# Patient Record
Sex: Female | Born: 1970 | Race: White | Hispanic: No | Marital: Married | Smoking: Never smoker
Health system: Southern US, Community
[De-identification: ages and names within clinical notes are randomized; demographics above are authoritative.]

## PROBLEM LIST (undated history)

## (undated) DIAGNOSIS — D649 Anemia, unspecified: Secondary | ICD-10-CM

## (undated) DIAGNOSIS — J189 Pneumonia, unspecified organism: Secondary | ICD-10-CM

## (undated) DIAGNOSIS — F32A Depression, unspecified: Secondary | ICD-10-CM

## (undated) DIAGNOSIS — F319 Bipolar disorder, unspecified: Secondary | ICD-10-CM

## (undated) DIAGNOSIS — G43909 Migraine, unspecified, not intractable, without status migrainosus: Secondary | ICD-10-CM

## (undated) DIAGNOSIS — M5136 Other intervertebral disc degeneration, lumbar region: Secondary | ICD-10-CM

## (undated) DIAGNOSIS — E079 Disorder of thyroid, unspecified: Secondary | ICD-10-CM

## (undated) DIAGNOSIS — M51369 Other intervertebral disc degeneration, lumbar region without mention of lumbar back pain or lower extremity pain: Secondary | ICD-10-CM

## (undated) DIAGNOSIS — F419 Anxiety disorder, unspecified: Secondary | ICD-10-CM

## (undated) DIAGNOSIS — K2970 Gastritis, unspecified, without bleeding: Secondary | ICD-10-CM

## (undated) DIAGNOSIS — E039 Hypothyroidism, unspecified: Secondary | ICD-10-CM

## (undated) DIAGNOSIS — I341 Nonrheumatic mitral (valve) prolapse: Secondary | ICD-10-CM

## (undated) DIAGNOSIS — Z9889 Other specified postprocedural states: Secondary | ICD-10-CM

## (undated) DIAGNOSIS — N189 Chronic kidney disease, unspecified: Secondary | ICD-10-CM

## (undated) DIAGNOSIS — R112 Nausea with vomiting, unspecified: Secondary | ICD-10-CM

## (undated) HISTORY — PX: ABDOMINAL HYSTERECTOMY: SHX81

## (undated) HISTORY — DX: Other intervertebral disc degeneration, lumbar region: M51.36

## (undated) HISTORY — PX: PLANTAR FASCIA SURGERY: SHX746

## (undated) HISTORY — PX: US ECHOCARDIOGRAPHY: HXRAD669

## (undated) HISTORY — DX: Other intervertebral disc degeneration, lumbar region without mention of lumbar back pain or lower extremity pain: M51.369

## (undated) HISTORY — PX: DILATION AND CURETTAGE OF UTERUS: SHX78

## (undated) HISTORY — PX: CYSTECTOMY: SUR359

---

## 2001-10-15 ENCOUNTER — Other Ambulatory Visit: Admission: RE | Admit: 2001-10-15 | Discharge: 2001-10-15 | Payer: Self-pay | Admitting: Obstetrics and Gynecology

## 2002-06-06 ENCOUNTER — Other Ambulatory Visit (HOSPITAL_COMMUNITY): Admission: RE | Admit: 2002-06-06 | Discharge: 2002-06-14 | Payer: Self-pay | Admitting: Psychiatry

## 2002-12-03 ENCOUNTER — Other Ambulatory Visit: Admission: RE | Admit: 2002-12-03 | Discharge: 2002-12-03 | Payer: Self-pay | Admitting: Obstetrics and Gynecology

## 2003-02-07 ENCOUNTER — Ambulatory Visit (HOSPITAL_COMMUNITY): Admission: RE | Admit: 2003-02-07 | Discharge: 2003-02-07 | Payer: Self-pay | Admitting: Obstetrics and Gynecology

## 2003-12-26 ENCOUNTER — Other Ambulatory Visit: Admission: RE | Admit: 2003-12-26 | Discharge: 2003-12-26 | Payer: Self-pay | Admitting: Obstetrics and Gynecology

## 2004-12-28 ENCOUNTER — Other Ambulatory Visit: Admission: RE | Admit: 2004-12-28 | Discharge: 2004-12-28 | Payer: Self-pay | Admitting: Obstetrics and Gynecology

## 2005-06-23 ENCOUNTER — Encounter: Admission: RE | Admit: 2005-06-23 | Discharge: 2005-06-23 | Payer: Self-pay | Admitting: Obstetrics and Gynecology

## 2006-01-10 ENCOUNTER — Other Ambulatory Visit: Admission: RE | Admit: 2006-01-10 | Discharge: 2006-01-10 | Payer: Self-pay | Admitting: Obstetrics and Gynecology

## 2006-05-02 ENCOUNTER — Inpatient Hospital Stay (HOSPITAL_COMMUNITY): Admission: RE | Admit: 2006-05-02 | Discharge: 2006-05-03 | Payer: Self-pay | Admitting: Obstetrics and Gynecology

## 2006-08-03 DIAGNOSIS — D229 Melanocytic nevi, unspecified: Secondary | ICD-10-CM

## 2006-08-03 HISTORY — DX: Melanocytic nevi, unspecified: D22.9

## 2010-10-31 ENCOUNTER — Encounter: Payer: Self-pay | Admitting: Obstetrics and Gynecology

## 2012-05-23 ENCOUNTER — Other Ambulatory Visit: Payer: Self-pay | Admitting: Physician Assistant

## 2012-06-28 ENCOUNTER — Other Ambulatory Visit: Payer: Self-pay | Admitting: Physician Assistant

## 2012-08-02 ENCOUNTER — Other Ambulatory Visit: Payer: Self-pay | Admitting: Physician Assistant

## 2012-08-28 ENCOUNTER — Other Ambulatory Visit: Payer: Self-pay | Admitting: Physician Assistant

## 2013-07-19 ENCOUNTER — Other Ambulatory Visit: Payer: Self-pay | Admitting: Physician Assistant

## 2013-08-22 ENCOUNTER — Emergency Department (HOSPITAL_COMMUNITY)
Admission: EM | Admit: 2013-08-22 | Discharge: 2013-08-22 | Disposition: A | Discharge status: Home / Self Care | Payer: BC Managed Care – PPO | Attending: Emergency Medicine | Admitting: Emergency Medicine

## 2013-08-22 ENCOUNTER — Encounter (HOSPITAL_COMMUNITY): Payer: Self-pay | Admitting: Emergency Medicine

## 2013-08-22 DIAGNOSIS — E039 Hypothyroidism, unspecified: Secondary | ICD-10-CM | POA: Insufficient documentation

## 2013-08-22 DIAGNOSIS — Z791 Long term (current) use of non-steroidal anti-inflammatories (NSAID): Secondary | ICD-10-CM | POA: Insufficient documentation

## 2013-08-22 DIAGNOSIS — G43909 Migraine, unspecified, not intractable, without status migrainosus: Secondary | ICD-10-CM | POA: Insufficient documentation

## 2013-08-22 DIAGNOSIS — R519 Headache, unspecified: Secondary | ICD-10-CM

## 2013-08-22 DIAGNOSIS — Z79899 Other long term (current) drug therapy: Secondary | ICD-10-CM | POA: Insufficient documentation

## 2013-08-22 DIAGNOSIS — F319 Bipolar disorder, unspecified: Secondary | ICD-10-CM | POA: Insufficient documentation

## 2013-08-22 HISTORY — DX: Bipolar disorder, unspecified: F31.9

## 2013-08-22 HISTORY — DX: Disorder of thyroid, unspecified: E07.9

## 2013-08-22 HISTORY — DX: Migraine, unspecified, not intractable, without status migrainosus: G43.909

## 2013-08-22 HISTORY — DX: Hypothyroidism, unspecified: E03.9

## 2013-08-22 MED ORDER — MAGNESIUM SULFATE 40 MG/ML IJ SOLN: 2.0000 g | Freq: Once | INTRAMUSCULAR | Status: DC

## 2013-08-22 MED ORDER — METOCLOPRAMIDE HCL 5 MG/ML IJ SOLN
10.0000 mg | Freq: Once | INTRAMUSCULAR | Status: AC
Start: 2013-08-22 — End: 2013-08-22
  Administered 2013-08-22: 10 mg via INTRAVENOUS
  Filled 2013-08-22: qty 2

## 2013-08-22 MED ORDER — PROCHLORPERAZINE EDISYLATE 5 MG/ML IJ SOLN: 10.0000 mg | Freq: Four times a day (QID) | INTRAMUSCULAR | Status: DC | PRN

## 2013-08-22 MED ORDER — SODIUM CHLORIDE 0.9 % IV BOLUS (SEPSIS)
1000.0000 mL | Freq: Once | INTRAVENOUS | Status: AC
  Administered 2013-08-22: 1000 mL via INTRAVENOUS

## 2013-08-22 MED ORDER — DIPHENHYDRAMINE HCL 50 MG/ML IJ SOLN
25.0000 mg | Freq: Once | INTRAMUSCULAR | Status: AC
  Administered 2013-08-22: 25 mg via INTRAVENOUS
  Filled 2013-08-22: qty 1

## 2013-08-22 MED ORDER — DEXAMETHASONE SODIUM PHOSPHATE 10 MG/ML IJ SOLN
10.0000 mg | Freq: Once | INTRAMUSCULAR | Status: AC
  Administered 2013-08-22: 10 mg via INTRAVENOUS
  Filled 2013-08-22: qty 1

## 2013-08-22 MED ORDER — PROCHLORPERAZINE EDISYLATE 5 MG/ML IJ SOLN
10.0000 mg | Freq: Once | INTRAMUSCULAR | Status: AC
  Administered 2013-08-22: 10 mg via INTRAVENOUS
  Filled 2013-08-22: qty 2

## 2013-08-22 NOTE — ED Notes (Signed)
Pt reports hx of migraines. Having migraine, sensitivity to light and nausea since 11/3, no relief with meds prescribed.

## 2013-08-22 NOTE — ED Provider Notes (Signed)
CSN: [id number]     Arrival date & time 08/22/13  1620 History   First MD Initiated Contact with Patient 08/22/13 1746     Chief Complaint  Patient presents with  . Migraine   (Consider location/radiation/quality/duration/timing/severity/associated sxs/prior Treatment) HPI Onset was 11 days ago, headache has been waxing and waning. Pt with diagnosis of migraines. Sees a headache specialist. Has only 1-2 headache free days per month. Never been to ED for headache before. Headache is similar to usual but more severe, long lasting.  The pain is rated as severe, located to right side of head. Modifying factors: worse with light.  Associated symptoms: nausea, no fever, no neck stiffness.  Recent medical care: seen by neurologist several times. Has tried several meds including fioricet, midrin, IM toradol, ultram without significant improvement.   Past Medical History  Diagnosis Date  . Migraine   . Bipolar 1 disorder   . Thyroid disease   . Hypothyroidism    Past Surgical History  Procedure Laterality Date  . Abdominal hysterectomy     History reviewed. No pertinent family history. History  Substance Use Topics  . Smoking status: Never Smoker   . Smokeless tobacco: Not on file  . Alcohol Use: No   OB History   Grav Para Term Preterm Abortions TAB SAB Ect Mult Living                 Review of Systems Constitutional: Negative for fever.  Eyes: Negative for vision loss.  ENT: Negative for difficulty swallowing.  Cardiovascular: Negative for chest pain. Respiratory: Negative for respiratory distress.  Gastrointestinal:  Negative for vomiting.  Genitourinary: Negative for inability to void.  Musculoskeletal: Negative for gait problem.  Integumentary: Negative for rash.  Neurological: Negative for new focal weakness.     Allergies  Codeine; Depakote; and Morphine and related  Home Medications   Current Outpatient Rx  Name  Route  Sig  Dispense  Refill  . ALPRAZolam (XANAX)  0.5 MG tablet   Oral   Take 0.5 mg by mouth at bedtime as needed for anxiety.         . Asenapine Maleate (SAPHRIS) 10 MG SUBL   Sublingual   Place 1 tablet under the tongue every evening.         Marland Kitchen atenolol (TENORMIN) 25 MG tablet   Oral   Take 25 mg by mouth daily.         . butalbital-acetaminophen-caffeine (FIORICET, ESGIC) 50-325-40 MG per tablet   Oral   Take 1 tablet by mouth daily as needed for headache.         . clonazePAM (KLONOPIN) 1 MG tablet   Oral   Take 1 mg by mouth 3 (three) times daily.         Marland Kitchen ibuprofen (ADVIL,MOTRIN) 800 MG tablet   Oral   Take 800 mg by mouth 3 (three) times daily.         Marland Kitchen isometheptene-acetaminophen-dichloralphenazone (MIDRIN) 65-325-100 MG capsule   Oral   Take 1 capsule by mouth 4 (four) times daily as needed for migraine. Maximum 5 capsules in 12 hours for migraine headaches, 8 capsules in 24 hours for tension headaches.         . lamoTRIgine (LAMICTAL) 200 MG tablet   Oral   Take 200 mg by mouth 2 (two) times daily.         Marland Kitchen levothyroxine (SYNTHROID, LEVOTHROID) 25 MCG tablet   Oral   Take 25 mcg by  mouth daily before breakfast.         . lithium carbonate (ESKALITH) 450 MG CR tablet   Oral   Take 450 mg by mouth daily.         . methylergonovine (METHERGINE) 0.2 MG tablet   Oral   Take 0.2 mg by mouth See admin instructions. Take 1 tablet by mouth 3 times a day for 2 days, 1 tab twice a day for two days , and 1 tablet a day for two days. Patient is in the first day of taking the two tablets as of 08-22-13         . promethazine (PHENERGAN) 25 MG tablet   Oral   Take 25 mg by mouth every 4 (four) hours as needed for nausea or vomiting.         . topiramate (TOPAMAX) 100 MG tablet   Oral   Take 100 mg by mouth 2 (two) times daily.         . traMADol (ULTRAM) 50 MG tablet   Oral   Take 50 mg by mouth 3 (three) times daily as needed. For headache          BP 112/74  Pulse 60  Temp(Src)  98.5 F (36.9 C) (Oral)  Resp 16  Ht 5\' 10"  (1.778 m)  Wt 205 lb 9 oz (93.243 kg)  BMI 29.50 kg/m2  SpO2 97% Physical Exam Nursing note and vitals reviewed.  Constitutional: Pt is alert and appears stated age. Eyes: No injection, no scleral icterus. HENT: Atraumatic, airway open without erythema or exudate.  Respiratory: No respiratory distress. Equal breathing bilaterally. Cardiovascular: Normal rate. Extremities warm and well perfused.  Abdomen: Soft, non-tender. MSK: Extremities are atraumatic without deformity. Skin: No rash, no wounds.   Neuro: No motor nor sensory deficit. GCS 15. CN intact. Coordination in tact. Gait slow but normal.      ED Course  Procedures (including critical care time) Labs Review Labs Reviewed - No data to display Imaging Review No results found.  EKG Interpretation   None       MDM   1. Headache    42 y.o. female w/ PMHx of migraines, bipolar 1, hypothyroidism presents w/ headache c/w prior migraines but worse and longer lasting. Not c/w SAH, meningitis. No trauma. Pt appears uncomfortable, but NAD. Normal neuro exam. Do not feel imaging or work up indicated. Pt treated with IVF, IV compizine, IV benadryl, IV decadron. Pt doing better on re-eval with HA now 5/10. Pt requests something to eat and if there's anything to maker her headache even better. Given IV reglan. On re-eval pt feels much better, ready to go home. Pt with normal neuro exam. Plan for patient to call neurologist in AM to discuss, return if problems. Pt and husband verbalized understanding. Counseling provided regarding diagnosis, treatment plan, follow up recommendations, and return precautions. Questions answered.       I independently viewed, interpreted, and used in my medical decision making all ordered lab and imaging tests. Medical Decision Making discussed with ED attending Dr. Blinda Leatherwood.      Charm Barges, MD 08/22/13 [phone]

## 2013-08-22 NOTE — Discharge Instructions (Signed)

## 2013-08-23 ENCOUNTER — Emergency Department (HOSPITAL_COMMUNITY)
Admission: EM | Admit: 2013-08-23 | Discharge: 2013-08-23 | Disposition: A | Discharge status: Home / Self Care | Payer: BC Managed Care – PPO | Attending: Emergency Medicine | Admitting: Emergency Medicine

## 2013-08-23 DIAGNOSIS — F319 Bipolar disorder, unspecified: Secondary | ICD-10-CM | POA: Insufficient documentation

## 2013-08-23 DIAGNOSIS — E039 Hypothyroidism, unspecified: Secondary | ICD-10-CM | POA: Insufficient documentation

## 2013-08-23 DIAGNOSIS — E079 Disorder of thyroid, unspecified: Secondary | ICD-10-CM | POA: Insufficient documentation

## 2013-08-23 DIAGNOSIS — Z8669 Personal history of other diseases of the nervous system and sense organs: Secondary | ICD-10-CM | POA: Insufficient documentation

## 2013-08-23 DIAGNOSIS — G43909 Migraine, unspecified, not intractable, without status migrainosus: Secondary | ICD-10-CM

## 2013-08-23 DIAGNOSIS — R42 Dizziness and giddiness: Secondary | ICD-10-CM | POA: Insufficient documentation

## 2013-08-23 DIAGNOSIS — Z79899 Other long term (current) drug therapy: Secondary | ICD-10-CM | POA: Insufficient documentation

## 2013-08-23 DIAGNOSIS — Z888 Allergy status to other drugs, medicaments and biological substances status: Secondary | ICD-10-CM | POA: Insufficient documentation

## 2013-08-23 DIAGNOSIS — Z885 Allergy status to narcotic agent status: Secondary | ICD-10-CM | POA: Insufficient documentation

## 2013-08-23 MED ORDER — DIPHENHYDRAMINE HCL 50 MG/ML IJ SOLN
25.0000 mg | Freq: Once | INTRAMUSCULAR | Status: AC
  Administered 2013-08-23: 25 mg via INTRAVENOUS
  Filled 2013-08-23: qty 1

## 2013-08-23 MED ORDER — SUMATRIPTAN SUCCINATE 100 MG PO TABS: 100.0000 mg | ORAL_TABLET | ORAL | Status: DC | PRN

## 2013-08-23 MED ORDER — KETOROLAC TROMETHAMINE 30 MG/ML IJ SOLN
30.0000 mg | Freq: Once | INTRAMUSCULAR | Status: AC
  Administered 2013-08-23: 30 mg via INTRAVENOUS
  Filled 2013-08-23: qty 1

## 2013-08-23 MED ORDER — SODIUM CHLORIDE 0.9 % IV BOLUS (SEPSIS)
1000.0000 mL | Freq: Once | INTRAVENOUS | Status: AC
  Administered 2013-08-23: 1000 mL via INTRAVENOUS

## 2013-08-23 MED ORDER — METOCLOPRAMIDE HCL 5 MG/ML IJ SOLN
10.0000 mg | Freq: Once | INTRAMUSCULAR | Status: AC
  Administered 2013-08-23: 10 mg via INTRAVENOUS
  Filled 2013-08-23: qty 2

## 2013-08-23 NOTE — ED Notes (Signed)
PT was seen here last night for migraine and treated; pain got better; called neurologist and was told to come here since we have more meds.

## 2013-08-23 NOTE — ED Provider Notes (Addendum)
CSN: [id number]     Arrival date & time 08/23/13  1101 History   First MD Initiated Contact with Patient 08/23/13 1139     Chief Complaint  Patient presents with  . Migraine   (Consider location/radiation/quality/duration/timing/severity/associated sxs/prior Treatment) Patient is a 42 y.o. female presenting with migraines. The history is provided by the patient.  Migraine This is a chronic problem. Episode onset: 11 days ago. The problem occurs constantly. The problem has been gradually worsening. Associated symptoms include headaches. Pertinent negatives include no abdominal pain and no shortness of breath. Associated symptoms comments: Mild dizziness that occurs with moving the eyes.  Started with nausea and vomiting but no vomiting since the first day.  No neck pain or fever. Exacerbated by: Bright lights, walking, loud noise. The symptoms are relieved by relaxation, NSAIDs, lying down and rest. Treatments tried: Seen in the emergency room yesterday and received a headache cocktail which initially helped but the headache returned around 3 AM this morning. The treatment provided no relief.    Past Medical History  Diagnosis Date  . Migraine   . Bipolar 1 disorder   . Thyroid disease   . Hypothyroidism    Past Surgical History  Procedure Laterality Date  . Abdominal hysterectomy     No family history on file. History  Substance Use Topics  . Smoking status: Never Smoker   . Smokeless tobacco: Not on file  . Alcohol Use: No   OB History   Grav Para Term Preterm Abortions TAB SAB Ect Mult Living                 Review of Systems  Respiratory: Negative for shortness of breath.   Gastrointestinal: Negative for abdominal pain.  Neurological: Positive for headaches.  All other systems reviewed and are negative.    Allergies  Codeine; Depakote; and Morphine and related  Home Medications   Current Outpatient Rx  Name  Route  Sig  Dispense  Refill  . ALPRAZolam (XANAX) 0.5  MG tablet   Oral   Take 0.5 mg by mouth at bedtime as needed for anxiety.         Marland Kitchen atenolol (TENORMIN) 25 MG tablet   Oral   Take 25 mg by mouth daily.         . butalbital-acetaminophen-caffeine (FIORICET, ESGIC) 50-325-40 MG per tablet   Oral   Take 1 tablet by mouth daily as needed for headache.         . clonazePAM (KLONOPIN) 1 MG tablet   Oral   Take 1 mg by mouth 3 (three) times daily.         Marland Kitchen ibuprofen (ADVIL,MOTRIN) 800 MG tablet   Oral   Take 800 mg by mouth 3 (three) times daily.         Marland Kitchen isometheptene-acetaminophen-dichloralphenazone (MIDRIN) 65-325-100 MG capsule   Oral   Take 1 capsule by mouth 4 (four) times daily as needed for migraine. Maximum 5 capsules in 12 hours for migraine headaches, 8 capsules in 24 hours for tension headaches.         . lamoTRIgine (LAMICTAL) 200 MG tablet   Oral   Take 200 mg by mouth 2 (two) times daily.         Marland Kitchen levothyroxine (SYNTHROID, LEVOTHROID) 25 MCG tablet   Oral   Take 25 mcg by mouth daily before breakfast.         . lithium carbonate (ESKALITH) 450 MG CR tablet   Oral  Take 450 mg by mouth daily.         . methylergonovine (METHERGINE) 0.2 MG tablet   Oral   Take 0.2 mg by mouth See admin instructions. Take 1 tablet by mouth 3 times a day for 2 days, 1 tab twice a day for two days , and 1 tablet a day for two days. Patient is in the first day of taking the two tablets as of 08-22-13         . promethazine (PHENERGAN) 25 MG tablet   Oral   Take 25 mg by mouth every 4 (four) hours as needed for nausea or vomiting.         . topiramate (TOPAMAX) 100 MG tablet   Oral   Take 100 mg by mouth 2 (two) times daily.         . traMADol (ULTRAM) 50 MG tablet   Oral   Take 50 mg by mouth 3 (three) times daily as needed. For headache         . Asenapine Maleate (SAPHRIS) 10 MG SUBL   Sublingual   Place 1 tablet under the tongue every evening.          BP 118/71  Pulse 61  Temp(Src)  97.7 F (36.5 C)  Resp 18  SpO2 97% Physical Exam  Nursing note and vitals reviewed. Constitutional: She is oriented to person, place, and time. She appears well-developed and well-nourished. She appears distressed.  HENT:  Head: Normocephalic and atraumatic.  Eyes: EOM are normal. Pupils are equal, round, and reactive to light.  Fundoscopic exam:      The right eye shows no papilledema.       The left eye shows no papilledema.  Neck: Normal range of motion. Neck supple.  Cardiovascular: Normal rate, regular rhythm, normal heart sounds and intact distal pulses.  Exam reveals no friction rub.   No murmur heard. Pulmonary/Chest: Effort normal and breath sounds normal. She has no wheezes. She has no rales.  Abdominal: Soft. Bowel sounds are normal. She exhibits no distension. There is no tenderness. There is no rebound and no guarding.  Musculoskeletal: Normal range of motion. She exhibits no tenderness.  No edema  Lymphadenopathy:    She has no cervical adenopathy.  Neurological: She is alert and oriented to person, place, and time. She has normal strength. No cranial nerve deficit or sensory deficit. Gait normal.  photophobia  Skin: Skin is warm and dry. No rash noted.  Psychiatric: She has a normal mood and affect. Her behavior is normal.    ED Course  Procedures (including critical care time) Labs Review Labs Reviewed - No data to display Imaging Review No results found.  EKG Interpretation   None       MDM   1. Migraine     Pt with typical migraine HA without sx suggestive of SAH(sudden onset, worst of life, or deficits), infection, or cavernous vein thrombosis.  Normal neuro exam and vital signs.  Pt seen yesterday but HA returned. Will give HA cocktail and will re-eval.  2:26 PM HA is much improved.  Will d/c home.   Gwyneth Sprout, MD 08/23/13 1426  Gwyneth Sprout, MD 08/23/13 1427

## 2013-08-27 NOTE — ED Provider Notes (Signed)
I saw and evaluated the patient, reviewed the resident's note and I agree with the findings and plan.  EKG Interpretation   None       Evaluate for headache consistent with migraines. She has a previous diagnosis of migraines and there are no unusual features. Patient treated with typical migraine treatment with improvement.  Gilda Crease, MD 08/27/13 [phone]

## 2014-02-04 ENCOUNTER — Other Ambulatory Visit (HOSPITAL_COMMUNITY): Payer: Self-pay | Admitting: Psychiatry

## 2014-02-04 DIAGNOSIS — R51 Headache: Principal | ICD-10-CM

## 2014-02-04 DIAGNOSIS — R519 Headache, unspecified: Secondary | ICD-10-CM

## 2014-02-17 ENCOUNTER — Ambulatory Visit (HOSPITAL_COMMUNITY)
Admission: RE | Admit: 2014-02-17 | Discharge: 2014-02-17 | Disposition: A | Discharge status: Home / Self Care | Payer: BC Managed Care – PPO | Source: Ambulatory Visit | Attending: Psychiatry | Admitting: Psychiatry

## 2014-02-17 DIAGNOSIS — R51 Headache: Secondary | ICD-10-CM | POA: Insufficient documentation

## 2014-02-17 DIAGNOSIS — G8929 Other chronic pain: Secondary | ICD-10-CM

## 2014-02-17 DIAGNOSIS — R519 Other chronic pain: Secondary | ICD-10-CM

## 2014-04-02 ENCOUNTER — Encounter: Payer: Self-pay | Admitting: *Deleted

## 2014-06-02 ENCOUNTER — Ambulatory Visit (INDEPENDENT_AMBULATORY_CARE_PROVIDER_SITE_OTHER): Payer: BC Managed Care – PPO

## 2014-06-02 ENCOUNTER — Ambulatory Visit (INDEPENDENT_AMBULATORY_CARE_PROVIDER_SITE_OTHER): Payer: BC Managed Care – PPO | Admitting: Podiatry

## 2014-06-02 DIAGNOSIS — M79609 Pain in unspecified limb: Secondary | ICD-10-CM

## 2014-06-02 DIAGNOSIS — M722 Plantar fascial fibromatosis: Secondary | ICD-10-CM

## 2014-06-02 DIAGNOSIS — M79671 Pain in right foot: Secondary | ICD-10-CM

## 2014-06-02 NOTE — Progress Notes (Signed)
Subjective:     Patient ID: Patricia Adkins, female   DOB: 24-Nov-1970, 43 y.o.   MRN: [id number]  HPI patient presents stating I have had chronic plantar fasciitis now for 16 months right heel and I have had numerous conservative care including cortisone night splint complete immobilization and Topaz treatment without relief of my plantar heel. I'm also getting pain in my forefoot for the last 2-3 months and moderate discomfort in the outside of my right foot and I have been wearing my boot again for the last several weeks   Review of Systems  All other systems reviewed and are negative.      Objective:   Physical Exam  Nursing note and vitals reviewed. Constitutional: She is oriented to person, place, and time.  Cardiovascular: Intact distal pulses.   Musculoskeletal: Normal range of motion.  Neurological: She is oriented to person, place, and time.  Skin: Skin is warm.   neurovascular status intact with muscle strength adequate and range of motion of the subtalar and midtarsal joint within normal limits. No equinus condition was noted and there is exquisite discomfort plantar heel at the insertional point of the tendon into the calcaneus with moderate discomfort on the dorsum of the right second metatarsal shaft and also mild discomfort on the lateral side of the foot. Patient's digits are well-perfused and she is well oriented x3     Assessment:     Probable chronic plan her fasciitis right leading to change in gait creating other symptoms that she is experiencing    Plan:     H&P reviewed and x-rays reviewed. Appears to be more deep dorsal tendinitis versus stress fracture and today I explained that to her and also reviewed custom orthotics which given her long-term problems she is going to need. She is scanned for custom orthotics today and also we discussed the considerations for shockwave therapy or open cutting surgery for the chronic plantar fasciitis the patient is experiencing.  Patient will think about this and we will decide what we'll be best for her

## 2014-06-02 NOTE — Progress Notes (Signed)
   Subjective:    Patient ID: Patricia Adkins, female    DOB: 1970-11-03, 43 y.o.   MRN: [id number]  HPI Comments: Pt states she initially was treated for plantar fasciitis of the right foot by Dr Wende Neighbors and later treated with the Topaz therapy without relief.  Pt currently has right forefoot pain over the 2nd metatarsal distal area for 2 - 3 months.  Pt is currently in [employer], [employer].     Review of Systems  Musculoskeletal: Positive for arthralgias.       Left shoulder decreased ROM  Skin:       4 moles removed.  Allergic/Immunologic:       Dog allergy.  All other systems reviewed and are negative.      Objective:   Physical Exam        Assessment & Plan:

## 2014-06-23 ENCOUNTER — Ambulatory Visit: Payer: BC Managed Care – PPO | Admitting: Podiatry

## 2014-06-23 ENCOUNTER — Ambulatory Visit: Payer: BC Managed Care – PPO

## 2014-06-23 DIAGNOSIS — M722 Plantar fascial fibromatosis: Secondary | ICD-10-CM

## 2014-06-23 NOTE — Progress Notes (Signed)
Pt is here to [facility] 

## 2014-06-23 NOTE — Patient Instructions (Signed)

## 2014-06-25 ENCOUNTER — Other Ambulatory Visit (HOSPITAL_COMMUNITY): Payer: Self-pay | Admitting: Psychiatry

## 2014-06-25 DIAGNOSIS — M542 Cervicalgia: Secondary | ICD-10-CM

## 2014-06-25 DIAGNOSIS — M5412 Radiculopathy, cervical region: Secondary | ICD-10-CM

## 2014-06-30 ENCOUNTER — Ambulatory Visit (INDEPENDENT_AMBULATORY_CARE_PROVIDER_SITE_OTHER): Payer: BC Managed Care – PPO | Admitting: Podiatry

## 2014-06-30 ENCOUNTER — Encounter: Payer: Self-pay | Admitting: Podiatry

## 2014-06-30 VITALS — BP 119/68 | HR 75 | Resp 18

## 2014-06-30 DIAGNOSIS — M722 Plantar fascial fibromatosis: Secondary | ICD-10-CM

## 2014-07-01 NOTE — Progress Notes (Signed)
Subjective:     Patient ID: Patricia Adkins, female   DOB: [birth year], 43 y.o.   MRN: [id number]  HPI my heel has remained very sore   Review of Systems     Objective:   Physical Exam Neurovascular status intact with pain to palpation heel region of long-term nature    Assessment:     Plantar fasciitis right heel long-term nature    Plan:     Shockwave accomplished 2500 shocks 16 frequency 4.0 intensity

## 2014-07-07 ENCOUNTER — Ambulatory Visit (INDEPENDENT_AMBULATORY_CARE_PROVIDER_SITE_OTHER): Payer: BC Managed Care – PPO | Admitting: Podiatry

## 2014-07-07 DIAGNOSIS — M722 Plantar fascial fibromatosis: Secondary | ICD-10-CM

## 2014-07-09 ENCOUNTER — Ambulatory Visit (HOSPITAL_COMMUNITY)
Admission: RE | Admit: 2014-07-09 | Discharge: 2014-07-09 | Disposition: A | Discharge status: Home / Self Care | Payer: BC Managed Care – PPO | Source: Ambulatory Visit | Attending: Psychiatry | Admitting: Psychiatry

## 2014-07-09 DIAGNOSIS — M5412 Radiculopathy, cervical region: Secondary | ICD-10-CM

## 2014-07-09 DIAGNOSIS — M47812 Spondylosis without myelopathy or radiculopathy, cervical region: Secondary | ICD-10-CM | POA: Insufficient documentation

## 2014-07-09 DIAGNOSIS — M542 Cervicalgia: Secondary | ICD-10-CM

## 2014-07-09 DIAGNOSIS — R51 Headache: Secondary | ICD-10-CM | POA: Diagnosis present

## 2014-07-09 DIAGNOSIS — M5030 Other cervical disc degeneration, unspecified cervical region: Secondary | ICD-10-CM | POA: Insufficient documentation

## 2014-07-09 DIAGNOSIS — S139XXA Sprain of joints and ligaments of unspecified parts of neck, initial encounter: Secondary | ICD-10-CM | POA: Diagnosis present

## 2014-07-09 NOTE — Progress Notes (Signed)
Subjective:     Patient ID: Patricia Adkins, female   DOB: 05/22/1971, 43 y.o.   MRN: [id number]  HPI patient states that my heel is still bothering me but improved   Review of Systems     Objective:   Physical Exam Neurovascular status intact with continued discomfort plantar heel but not as intense as previous    Assessment:     Improving plantar fasciitis    Plan:     Shockwave #2 administered 4.0 2500 shocks 16 frequency tolerated well and reappoint one week

## 2014-07-14 ENCOUNTER — Ambulatory Visit: Payer: BC Managed Care – PPO | Admitting: Podiatry

## 2014-07-14 DIAGNOSIS — M722 Plantar fascial fibromatosis: Secondary | ICD-10-CM

## 2014-07-15 NOTE — Progress Notes (Signed)
Subjective:     Patient ID: Patricia Adkins, female   DOB: 1971/07/06, 43 y.o.   MRN: [id number]  HPI patient presents for shockwave #3 right states it's improving   Review of Systems     Objective:   Physical Exam Neurovascular status intact with diminishment of pain right plantar heel still present but improved    Assessment:     Doing better with shockwave    Plan:     Shockwave administered 2500 shocks 4.6 intensity 16 frequency and tolerated well by patient

## 2014-08-18 ENCOUNTER — Ambulatory Visit (INDEPENDENT_AMBULATORY_CARE_PROVIDER_SITE_OTHER): Payer: BC Managed Care – PPO | Admitting: Podiatry

## 2014-08-18 ENCOUNTER — Encounter: Payer: Self-pay | Admitting: Podiatry

## 2014-08-18 DIAGNOSIS — M722 Plantar fascial fibromatosis: Secondary | ICD-10-CM

## 2014-08-20 NOTE — Progress Notes (Signed)
Subjective:     Patient ID: Patricia Adkins, female   DOB: 1971-01-26, 43 y.o.   MRN: [id number]  HPIpatient presents for shockwave #4 stating her heel is feeling quite a bit better   Review of Systems     Objective:   Physical Exam Neurovascular status intact with mild discomfort plantar aspect right heel    Assessment:     Improving plantar fasciitis right with shockwave    Plan:     Administered fourth shock today 2500 shocks 16 frequency at level V.0 power

## 2015-01-08 ENCOUNTER — Ambulatory Visit (INDEPENDENT_AMBULATORY_CARE_PROVIDER_SITE_OTHER): Payer: BLUE CROSS/BLUE SHIELD | Admitting: Podiatry

## 2015-01-08 ENCOUNTER — Encounter: Payer: Self-pay | Admitting: Podiatry

## 2015-01-08 DIAGNOSIS — M722 Plantar fascial fibromatosis: Secondary | ICD-10-CM | POA: Diagnosis not present

## 2015-01-08 DIAGNOSIS — M79671 Pain in right foot: Secondary | ICD-10-CM

## 2015-01-08 NOTE — Progress Notes (Signed)
Subjective:     Patient ID: Patricia Adkins, female   DOB: 1970/11/05, 44 y.o.   MRN: [id number]  HPI patient states I'm doing pretty well with my heel with pain if I try to be real active and orthotics which I cannot wear in all shoes and I need a second pair they've been very beneficial   Review of Systems     Objective:   Physical Exam Neurovascular status intact muscle strength adequate with moderate depression of the arch and pain still in the plantar fascia but diminished quite nicely from previous visits    Assessment:     Improving plantar fasciitis but still bad after periods of sitting or after sleeping    Plan:     Instructed on importance of physical therapy and orthotics and scanned for second pair in order to reduce the plantar pressure and to get them into more of her shoe types. I also went ahead and dispensed night splint at the current time

## 2015-04-07 ENCOUNTER — Encounter (HOSPITAL_COMMUNITY): Payer: Self-pay | Admitting: Emergency Medicine

## 2015-04-07 DIAGNOSIS — R112 Nausea with vomiting, unspecified: Secondary | ICD-10-CM | POA: Insufficient documentation

## 2015-04-07 DIAGNOSIS — H53149 Visual discomfort, unspecified: Secondary | ICD-10-CM | POA: Diagnosis not present

## 2015-04-07 DIAGNOSIS — E039 Hypothyroidism, unspecified: Secondary | ICD-10-CM | POA: Diagnosis not present

## 2015-04-07 DIAGNOSIS — G43909 Migraine, unspecified, not intractable, without status migrainosus: Secondary | ICD-10-CM | POA: Diagnosis present

## 2015-04-07 DIAGNOSIS — G43009 Migraine without aura, not intractable, without status migrainosus: Secondary | ICD-10-CM | POA: Diagnosis not present

## 2015-04-07 DIAGNOSIS — Z791 Long term (current) use of non-steroidal anti-inflammatories (NSAID): Secondary | ICD-10-CM | POA: Diagnosis not present

## 2015-04-07 DIAGNOSIS — Z8739 Personal history of other diseases of the musculoskeletal system and connective tissue: Secondary | ICD-10-CM | POA: Diagnosis not present

## 2015-04-07 DIAGNOSIS — Z79899 Other long term (current) drug therapy: Secondary | ICD-10-CM | POA: Insufficient documentation

## 2015-04-07 DIAGNOSIS — F319 Bipolar disorder, unspecified: Secondary | ICD-10-CM | POA: Insufficient documentation

## 2015-04-07 MED ORDER — ONDANSETRON HCL 4 MG/2ML IJ SOLN
INTRAMUSCULAR | Status: AC
  Filled 2015-04-07: qty 2

## 2015-04-07 MED ORDER — FENTANYL CITRATE (PF) 100 MCG/2ML IJ SOLN
50.0000 ug | Freq: Once | INTRAMUSCULAR | Status: AC
  Administered 2015-04-07: 50 ug via INTRAVENOUS

## 2015-04-07 MED ORDER — ONDANSETRON HCL 4 MG/2ML IJ SOLN
4.0000 mg | Freq: Once | INTRAMUSCULAR | Status: AC
  Administered 2015-04-07: 4 mg via INTRAVENOUS

## 2015-04-07 MED ORDER — FENTANYL CITRATE (PF) 100 MCG/2ML IJ SOLN
INTRAMUSCULAR | Status: AC
  Filled 2015-04-07: qty 2

## 2015-04-07 NOTE — ED Notes (Signed)
Pt. reports migraine headache with nausea and photophobia onset last Thursday unrelieved by prescription medications .

## 2015-04-08 ENCOUNTER — Emergency Department (HOSPITAL_COMMUNITY)
Admission: EM | Admit: 2015-04-08 | Discharge: 2015-04-08 | Disposition: A | Discharge status: Home / Self Care | Payer: BLUE CROSS/BLUE SHIELD | Attending: Emergency Medicine | Admitting: Emergency Medicine

## 2015-04-08 DIAGNOSIS — G43009 Migraine without aura, not intractable, without status migrainosus: Secondary | ICD-10-CM

## 2015-04-08 MED ORDER — SODIUM CHLORIDE 0.9 % IV SOLN
1000.0000 mL | INTRAVENOUS | Status: DC
  Administered 2015-04-08: 1000 mL via INTRAVENOUS

## 2015-04-08 MED ORDER — METOCLOPRAMIDE HCL 5 MG/ML IJ SOLN
10.0000 mg | Freq: Once | INTRAMUSCULAR | Status: AC
  Administered 2015-04-08: 10 mg via INTRAVENOUS
  Filled 2015-04-08: qty 2

## 2015-04-08 MED ORDER — METOCLOPRAMIDE HCL 10 MG PO TABS: 10.0000 mg | ORAL_TABLET | Freq: Four times a day (QID) | ORAL | Status: DC | PRN

## 2015-04-08 MED ORDER — DIPHENHYDRAMINE HCL 50 MG/ML IJ SOLN
25.0000 mg | Freq: Once | INTRAMUSCULAR | Status: AC
Start: 2015-04-08 — End: 2015-04-08
  Administered 2015-04-08: 25 mg via INTRAVENOUS
  Filled 2015-04-08: qty 1

## 2015-04-08 MED ORDER — SODIUM CHLORIDE 0.9 % IV SOLN
1000.0000 mL | Freq: Once | INTRAVENOUS | Status: AC
  Administered 2015-04-08: 1000 mL via INTRAVENOUS

## 2015-04-08 MED ORDER — DEXAMETHASONE SODIUM PHOSPHATE 10 MG/ML IJ SOLN
10.0000 mg | Freq: Once | INTRAMUSCULAR | Status: AC
  Administered 2015-04-08: 10 mg via INTRAVENOUS
  Filled 2015-04-08: qty 1

## 2015-04-08 MED ORDER — KETOROLAC TROMETHAMINE 30 MG/ML IJ SOLN
30.0000 mg | Freq: Once | INTRAMUSCULAR | Status: AC
  Administered 2015-04-08: 30 mg via INTRAVENOUS
  Filled 2015-04-08: qty 1

## 2015-04-08 NOTE — Discharge Instructions (Signed)
Migraine Headache A migraine headache is an intense, throbbing pain on one or both sides of your head. A migraine can last for 30 minutes to several hours. CAUSES  The exact cause of a migraine headache is not always known. However, a migraine may be caused when nerves in the brain become irritated and release chemicals that cause inflammation. This causes pain. Certain things may also trigger migraines, such as:  Alcohol.  Smoking.  Stress.  Menstruation.  Aged cheeses.  Foods or drinks that contain nitrates, glutamate, aspartame, or tyramine.  Lack of sleep.  Chocolate.  Caffeine.  Hunger.  Physical exertion.  Fatigue.  Medicines used to treat chest pain (nitroglycerine), birth control pills, estrogen, and some blood pressure medicines. SIGNS AND SYMPTOMS  Pain on one or both sides of your head.  Pulsating or throbbing pain.  Severe pain that prevents daily activities.  Pain that is aggravated by any physical activity.  Nausea, vomiting, or both.  Dizziness.  Pain with exposure to bright lights, loud noises, or activity.  General sensitivity to bright lights, loud noises, or smells. Before you get a migraine, you may get warning signs that a migraine is coming (aura). An aura may include:  Seeing flashing lights.  Seeing bright spots, halos, or zigzag lines.  Having tunnel vision or blurred vision.  Having feelings of numbness or tingling.  Having trouble talking.  Having muscle weakness. DIAGNOSIS  A migraine headache is often diagnosed based on:  Symptoms.  Physical exam.  A CT scan or MRI of your head. These imaging tests cannot diagnose migraines, but they can help rule out other causes of headaches. TREATMENT Medicines may be given for pain and nausea. Medicines can also be given to help prevent recurrent migraines.  HOME CARE INSTRUCTIONS  Only take over-the-counter or prescription medicines for pain or discomfort as directed by your  health care provider. The use of long-term narcotics is not recommended.  Lie down in a dark, quiet room when you have a migraine.  Keep a journal to find out what may trigger your migraine headaches. For example, write down:  What you eat and drink.  How much sleep you get.  Any change to your diet or medicines.  Limit alcohol consumption.  Quit smoking if you smoke.  Get 7-9 hours of sleep, or as recommended by your health care provider.  Limit stress.  Keep lights dim if bright lights bother you and make your migraines worse. SEEK IMMEDIATE MEDICAL CARE IF:   Your migraine becomes severe.  You have a fever.  You have a stiff neck.  You have vision loss.  You have muscular weakness or loss of muscle control.  You start losing your balance or have trouble walking.  You feel faint or pass out.  You have severe symptoms that are different from your first symptoms. MAKE SURE YOU:   Understand these instructions.  Will watch your condition.  Will get help right away if you are not doing well or get worse. Document Released: 09/26/2005 Document Revised: 02/10/2014 Document Reviewed: 06/03/2013 [facility] Patient Information 2015 [place], [place]. This information is not intended to replace advice given to you by your health care provider. Make sure you discuss any questions you have with your health care provider.  Metoclopramide tablets What is this medicine? METOCLOPRAMIDE (met oh kloe PRA mide) is used to treat the symptoms of gastroesophageal reflux disease (GERD) like heartburn. It is also used to treat people with slow emptying of the stomach and  intestinal tract. This medicine may be used for other purposes; ask your health care provider or pharmacist if you have questions. COMMON BRAND NAME(S): Reglan What should I tell my health care provider before I take this medicine? They need to know if you have any of these conditions: -breast  cancer -depression -diabetes -heart failure -high blood pressure -kidney disease -liver disease -Parkinson's disease or a movement disorder -pheochromocytoma -seizures -stomach obstruction, bleeding, or perforation -an unusual or allergic reaction to metoclopramide, procainamide, sulfites, other medicines, foods, dyes, or preservatives -pregnant or trying to get pregnant -breast-feeding How should I use this medicine? Take this medicine by mouth with a glass of water. Follow the directions on the prescription label. Take this medicine on an empty stomach, about 30 minutes before eating. Take your doses at regular intervals. Do not take your medicine more often than directed. Do not stop taking except on the advice of your doctor or health care professional. A special MedGuide will be given to you by the pharmacist with each prescription and refill. Be sure to read this information carefully each time. Talk to your pediatrician regarding the use of this medicine in children. Special care may be needed. Overdosage: If you think you have taken too much of this medicine contact a poison control center or emergency room at once. NOTE: This medicine is only for you. Do not share this medicine with others. What if I miss a dose? If you miss a dose, take it as soon as you can. If it is almost time for your next dose, take only that dose. Do not take double or extra doses. What may interact with this medicine? -acetaminophen -cyclosporine -digoxin -medicines for blood pressure -medicines for diabetes, including insulin -medicines for hay fever and other allergies -medicines for depression, especially an Monoamine Oxidase Inhibitor (MAOI) -medicines for Parkinson's disease, like levodopa -medicines for sleep or for pain -tetracycline This list may not describe all possible interactions. Give your health care provider a list of all the medicines, herbs, non-prescription drugs, or dietary  supplements you use. Also tell them if you smoke, drink alcohol, or use illegal drugs. Some items may interact with your medicine. What should I watch for while using this medicine? It may take a few weeks for your stomach condition to start to get better. However, do not take this medicine for longer than 12 weeks. The longer you take this medicine, and the more you take it, the greater your chances are of developing serious side effects. If you are an elderly patient, a female patient, or you have diabetes, you may be at an increased risk for side effects from this medicine. Contact your doctor immediately if you start having movements you cannot control such as lip smacking, rapid movements of the tongue, involuntary or uncontrollable movements of the eyes, head, arms and legs, or muscle twitches and spasms. Patients and their families should watch out for worsening depression or thoughts of suicide. Also watch out for any sudden or severe changes in feelings such as feeling anxious, agitated, panicky, irritable, hostile, aggressive, impulsive, severely restless, overly excited and hyperactive, or not being able to sleep. If this happens, especially at the beginning of treatment or after a change in dose, call your doctor. Do not treat yourself for high fever. Ask your doctor or health care professional for advice. You may get drowsy or dizzy. Do not drive, use machinery, or do anything that needs mental alertness until you know how this drug affects you.  Do not stand or sit up quickly, especially if you are an older patient. This reduces the risk of dizzy or fainting spells. Alcohol can make you more drowsy and dizzy. Avoid alcoholic drinks. What side effects may I notice from receiving this medicine? Side effects that you should report to your doctor or health care professional as soon as possible: -allergic reactions like skin rash, itching or hives, swelling of the face, lips, or tongue -abnormal  production of milk in females -breast enlargement in both males and females -change in the way you walk -difficulty moving, speaking or swallowing -drooling, lip smacking, or rapid movements of the tongue -excessive sweating -fever -involuntary or uncontrollable movements of the eyes, head, arms and legs -irregular heartbeat or palpitations -muscle twitches and spasms -unusually weak or tired Side effects that usually do not require medical attention (report to your doctor or health care professional if they continue or are bothersome): -change in sex drive or performance -depressed mood -diarrhea -difficulty sleeping -headache -menstrual changes -restless or nervous This list may not describe all possible side effects. Call your doctor for medical advice about side effects. You may report side effects to FDA at 1-800-FDA-1088. Where should I keep my medicine? Keep out of the reach of children. Store at room temperature between 20 and 25 degrees C (68 and 77 degrees F). Protect from light. Keep container tightly closed. Throw away any unused medicine after the expiration date. NOTE: This sheet is a summary. It may not cover all possible information. If you have questions about this medicine, talk to your doctor, pharmacist, or health care provider.  2015, [employer] Standard. (2012-01-24 13:04:38)

## 2015-04-08 NOTE — ED Provider Notes (Signed)
CSN: [id number]     Arrival date & time 04/07/15  2106 History   This chart was scribed for Delora Fuel, MD by Randa Evens, ED Scribe. This patient was seen in room A07C/A07C and the patient's care was started at 12:25 AM.    Chief Complaint  Patient presents with  . Migraine   The history is provided by the patient. No language interpreter was used.   HPI Comments: Patricia Adkins is a 44 y.o. female with PMHx of migraines who presents to the Emergency Department complaining of migraine HA onset 5 days prior. Pt states that the headache is located in her temporals and behind her right eye. Pt rates the severity of the pain 10/10. Pt states she has associated nausea, vomiting and photophobia. Pt states she has tried Fioricet, tylenol, ibuprofen, Midrin, ultram and phenergan with no relief. Pt states that her symptoms are worse with light and sound. Pt states that this feels like previous migraine HA's.   Past Medical History  Diagnosis Date  . Migraine     headaches (Dr. Catalina Gravel)  . Bipolar 1 disorder     (Dr. Toy Care)- Started after second pregnancy  . Thyroid disease   . Hypothyroidism   . Lumbar degenerative disc disease    Past Surgical History  Procedure Laterality Date  . Abdominal hysterectomy    . Cesarean section  1998  . Cystectomy      Uterine  . US echocardiography      Normal EF, mild MR/TR, Normal LV size and function. There were no regional wall motion abnormailities. Left Ventricular ejection fraction estimated by 2D at 60-65%. Mild mitral valve regurgitation. Mild tricuspid regurgitation   Family History  Problem Relation Age of Onset  . Arthritis Mother   . Hypertension Father     at a young age,   . Hyperlipidemia Father   . Hypertension Sister    History  Substance Use Topics  . Smoking status: Never Smoker   . Smokeless tobacco: Not on file  . Alcohol Use: No   OB History    No data available      Review of Systems  Eyes: Positive for photophobia.   Gastrointestinal: Positive for nausea and vomiting.  Neurological: Positive for headaches.  All other systems reviewed and are negative.    Allergies  Codeine; Depakote; Morphine and related; and Sulfa antibiotics  Home Medications   Prior to Admission medications   Medication Sig Start Date End Date Taking? Authorizing Provider  ALPRAZolam Duanne Moron) 0.5 MG tablet Take 0.5 mg by mouth at bedtime as needed for anxiety.   Yes Historical Provider, MD  butalbital-acetaminophen-caffeine (FIORICET, ESGIC) 50-325-40 MG per tablet Take 1 tablet by mouth daily as needed for headache.   Yes Historical Provider, MD  clonazePAM (KLONOPIN) 1 MG tablet Take 1 mg by mouth 3 (three) times daily.   Yes Historical Provider, MD  ibuprofen (ADVIL,MOTRIN) 800 MG tablet Take 800 mg by mouth 3 (three) times daily.   Yes Historical Provider, MD  isometheptene-acetaminophen-dichloralphenazone (MIDRIN) 65-325-100 MG capsule Take 1 capsule by mouth 4 (four) times daily as needed for migraine. Maximum 5 capsules in 12 hours for migraine headaches, 8 capsules in 24 hours for tension headaches.   Yes Historical Provider, MD  lamoTRIgine (LAMICTAL) 200 MG tablet Take 400 mg by mouth every evening.    Yes Historical Provider, MD  levothyroxine (SYNTHROID, LEVOTHROID) 25 MCG tablet Take 25 mcg by mouth daily before breakfast.   Yes Historical Provider,  MD  lithium carbonate (ESKALITH) 450 MG CR tablet Take 450 mg by mouth daily.   Yes Historical Provider, MD  phentermine (ADIPEX-P) 37.5 MG tablet Take 37.5 mg by mouth daily. 03/27/15  Yes Historical Provider, MD  promethazine (PHENERGAN) 25 MG tablet Take 25 mg by mouth every 4 (four) hours as needed for nausea or vomiting.   Yes Historical Provider, MD  QUEtiapine (SEROQUEL) 25 MG tablet Take 75 mg by mouth at bedtime.   Yes Historical Provider, MD  traMADol (ULTRAM) 50 MG tablet Take 50 mg by mouth 3 (three) times daily as needed. For headache   Yes Historical Provider, MD   Zonisamide (ZONEGRAN PO) Take 400 mg by mouth daily.    Yes Historical Provider, MD   BP 124/83 mmHg  Pulse 101  Temp(Src) 97.5 F (36.4 C) (Oral)  Resp 20  SpO2 100%   Physical Exam  Constitutional: She is oriented to person, place, and time. She appears well-developed and well-nourished. No distress.  HENT:  Head: Normocephalic and atraumatic.  No tenderness of frontalis, temporalis or paracervical muscle.   Eyes: Conjunctivae and EOM are normal. Pupils are equal, round, and reactive to light.  Fundi normal.   Neck: Normal range of motion. Neck supple. No JVD present.  Cardiovascular: Normal rate, regular rhythm and normal heart sounds.   No murmur heard. Pulmonary/Chest: Effort normal and breath sounds normal. She has no wheezes. She has no rales. She exhibits no tenderness.  Abdominal: Soft. Bowel sounds are normal. She exhibits no distension and no mass. There is no tenderness.  Musculoskeletal: Normal range of motion. She exhibits no edema.  Lymphadenopathy:    She has no cervical adenopathy.  Neurological: She is alert and oriented to person, place, and time. No cranial nerve deficit. She exhibits normal muscle tone. Coordination normal.  Skin: Skin is warm and dry. No rash noted.  Psychiatric: She has a normal mood and affect. Her behavior is normal. Judgment and thought content normal.  Nursing note and vitals reviewed.   ED Course  Procedures (including critical care time) DIAGNOSTIC STUDIES: Oxygen Saturation is 100% on RA, normal by my interpretation.    COORDINATION OF CARE: 12:33 AM-Discussed treatment plan with pt at bedside and pt agreed to plan.    MDM   Final diagnoses:  Migraine without aura and without status migrainosus, not intractable      Migraine headache. Old records are reviewed and she has 2 prior ED visits for migraine. She is given migraine cocktail of IV fluids, metoclopramide, diphenhydramine, ketorolac, and dexamethasone. Following  this, she had complete resolution of her headache. She is discharged with prescription for metoclopramide.   I personally performed the services described in this documentation, which was scribed in my presence. The recorded information has been reviewed and is accurate.       Delora Fuel, MD 70/35/00 [id number]

## 2015-09-14 DIAGNOSIS — M722 Plantar fascial fibromatosis: Secondary | ICD-10-CM

## 2016-02-19 ENCOUNTER — Other Ambulatory Visit: Payer: Self-pay | Admitting: Obstetrics and Gynecology

## 2016-02-19 DIAGNOSIS — N63 Unspecified lump in unspecified breast: Secondary | ICD-10-CM

## 2016-02-22 ENCOUNTER — Ambulatory Visit
Admission: RE | Admit: 2016-02-22 | Discharge: 2016-02-22 | Disposition: A | Discharge status: Home / Self Care | Payer: BLUE CROSS/BLUE SHIELD | Source: Ambulatory Visit | Attending: Obstetrics and Gynecology | Admitting: Obstetrics and Gynecology

## 2016-02-22 DIAGNOSIS — N63 Unspecified lump in unspecified breast: Secondary | ICD-10-CM

## 2016-02-24 ENCOUNTER — Other Ambulatory Visit: Payer: BLUE CROSS/BLUE SHIELD

## 2016-04-22 DIAGNOSIS — R0781 Pleurodynia: Secondary | ICD-10-CM | POA: Diagnosis not present

## 2016-06-07 ENCOUNTER — Other Ambulatory Visit: Payer: Self-pay | Admitting: Physician Assistant

## 2016-07-15 DIAGNOSIS — F3174 Bipolar disorder, in full remission, most recent episode manic: Secondary | ICD-10-CM | POA: Diagnosis not present

## 2016-08-02 DIAGNOSIS — G43909 Migraine, unspecified, not intractable, without status migrainosus: Secondary | ICD-10-CM | POA: Diagnosis not present

## 2016-08-02 DIAGNOSIS — M542 Cervicalgia: Secondary | ICD-10-CM | POA: Diagnosis not present

## 2016-08-02 DIAGNOSIS — M9902 Segmental and somatic dysfunction of thoracic region: Secondary | ICD-10-CM | POA: Diagnosis not present

## 2016-08-02 DIAGNOSIS — M9901 Segmental and somatic dysfunction of cervical region: Secondary | ICD-10-CM | POA: Diagnosis not present

## 2016-08-10 DIAGNOSIS — G43909 Migraine, unspecified, not intractable, without status migrainosus: Secondary | ICD-10-CM | POA: Diagnosis not present

## 2016-08-10 DIAGNOSIS — M9901 Segmental and somatic dysfunction of cervical region: Secondary | ICD-10-CM | POA: Diagnosis not present

## 2016-08-10 DIAGNOSIS — M542 Cervicalgia: Secondary | ICD-10-CM | POA: Diagnosis not present

## 2016-08-10 DIAGNOSIS — M9902 Segmental and somatic dysfunction of thoracic region: Secondary | ICD-10-CM | POA: Diagnosis not present

## 2016-08-17 DIAGNOSIS — G43909 Migraine, unspecified, not intractable, without status migrainosus: Secondary | ICD-10-CM | POA: Diagnosis not present

## 2016-08-17 DIAGNOSIS — M9902 Segmental and somatic dysfunction of thoracic region: Secondary | ICD-10-CM | POA: Diagnosis not present

## 2016-08-17 DIAGNOSIS — M9901 Segmental and somatic dysfunction of cervical region: Secondary | ICD-10-CM | POA: Diagnosis not present

## 2016-08-17 DIAGNOSIS — M542 Cervicalgia: Secondary | ICD-10-CM | POA: Diagnosis not present

## 2016-08-25 DIAGNOSIS — G43909 Migraine, unspecified, not intractable, without status migrainosus: Secondary | ICD-10-CM | POA: Diagnosis not present

## 2016-08-25 DIAGNOSIS — M542 Cervicalgia: Secondary | ICD-10-CM | POA: Diagnosis not present

## 2016-08-25 DIAGNOSIS — M9902 Segmental and somatic dysfunction of thoracic region: Secondary | ICD-10-CM | POA: Diagnosis not present

## 2016-08-25 DIAGNOSIS — M9901 Segmental and somatic dysfunction of cervical region: Secondary | ICD-10-CM | POA: Diagnosis not present

## 2016-09-05 DIAGNOSIS — Z23 Encounter for immunization: Secondary | ICD-10-CM | POA: Diagnosis not present

## 2016-09-05 DIAGNOSIS — E038 Other specified hypothyroidism: Secondary | ICD-10-CM | POA: Diagnosis not present

## 2016-09-07 DIAGNOSIS — M9901 Segmental and somatic dysfunction of cervical region: Secondary | ICD-10-CM | POA: Diagnosis not present

## 2016-09-07 DIAGNOSIS — M9902 Segmental and somatic dysfunction of thoracic region: Secondary | ICD-10-CM | POA: Diagnosis not present

## 2016-09-07 DIAGNOSIS — M542 Cervicalgia: Secondary | ICD-10-CM | POA: Diagnosis not present

## 2016-09-07 DIAGNOSIS — G43909 Migraine, unspecified, not intractable, without status migrainosus: Secondary | ICD-10-CM | POA: Diagnosis not present

## 2016-09-14 DIAGNOSIS — M542 Cervicalgia: Secondary | ICD-10-CM | POA: Diagnosis not present

## 2016-09-14 DIAGNOSIS — M9902 Segmental and somatic dysfunction of thoracic region: Secondary | ICD-10-CM | POA: Diagnosis not present

## 2016-09-14 DIAGNOSIS — G43909 Migraine, unspecified, not intractable, without status migrainosus: Secondary | ICD-10-CM | POA: Diagnosis not present

## 2016-09-14 DIAGNOSIS — M9901 Segmental and somatic dysfunction of cervical region: Secondary | ICD-10-CM | POA: Diagnosis not present

## 2016-09-15 DIAGNOSIS — G43011 Migraine without aura, intractable, with status migrainosus: Secondary | ICD-10-CM | POA: Diagnosis not present

## 2016-09-15 DIAGNOSIS — G43901 Migraine, unspecified, not intractable, with status migrainosus: Secondary | ICD-10-CM | POA: Diagnosis not present

## 2016-09-15 DIAGNOSIS — M25562 Pain in left knee: Secondary | ICD-10-CM | POA: Diagnosis not present

## 2016-09-15 DIAGNOSIS — F319 Bipolar disorder, unspecified: Secondary | ICD-10-CM | POA: Diagnosis not present

## 2016-09-15 DIAGNOSIS — M530 Cervicocranial syndrome: Secondary | ICD-10-CM | POA: Diagnosis not present

## 2016-09-21 DIAGNOSIS — M9901 Segmental and somatic dysfunction of cervical region: Secondary | ICD-10-CM | POA: Diagnosis not present

## 2016-09-21 DIAGNOSIS — M542 Cervicalgia: Secondary | ICD-10-CM | POA: Diagnosis not present

## 2016-09-21 DIAGNOSIS — G43909 Migraine, unspecified, not intractable, without status migrainosus: Secondary | ICD-10-CM | POA: Diagnosis not present

## 2016-09-21 DIAGNOSIS — M9902 Segmental and somatic dysfunction of thoracic region: Secondary | ICD-10-CM | POA: Diagnosis not present

## 2016-09-28 DIAGNOSIS — G43909 Migraine, unspecified, not intractable, without status migrainosus: Secondary | ICD-10-CM | POA: Diagnosis not present

## 2016-09-28 DIAGNOSIS — M9902 Segmental and somatic dysfunction of thoracic region: Secondary | ICD-10-CM | POA: Diagnosis not present

## 2016-09-28 DIAGNOSIS — M9901 Segmental and somatic dysfunction of cervical region: Secondary | ICD-10-CM | POA: Diagnosis not present

## 2016-09-28 DIAGNOSIS — M542 Cervicalgia: Secondary | ICD-10-CM | POA: Diagnosis not present

## 2016-11-11 DIAGNOSIS — Z6823 Body mass index (BMI) 23.0-23.9, adult: Secondary | ICD-10-CM | POA: Diagnosis not present

## 2016-11-11 DIAGNOSIS — Z01419 Encounter for gynecological examination (general) (routine) without abnormal findings: Secondary | ICD-10-CM | POA: Diagnosis not present

## 2016-11-11 DIAGNOSIS — Z1231 Encounter for screening mammogram for malignant neoplasm of breast: Secondary | ICD-10-CM | POA: Diagnosis not present

## 2016-11-23 DIAGNOSIS — R110 Nausea: Secondary | ICD-10-CM | POA: Diagnosis not present

## 2016-11-23 DIAGNOSIS — G43809 Other migraine, not intractable, without status migrainosus: Secondary | ICD-10-CM | POA: Diagnosis not present

## 2016-11-29 DIAGNOSIS — E039 Hypothyroidism, unspecified: Secondary | ICD-10-CM | POA: Diagnosis not present

## 2016-12-10 DIAGNOSIS — J069 Acute upper respiratory infection, unspecified: Secondary | ICD-10-CM | POA: Diagnosis not present

## 2016-12-10 DIAGNOSIS — J028 Acute pharyngitis due to other specified organisms: Secondary | ICD-10-CM | POA: Diagnosis not present

## 2016-12-30 DIAGNOSIS — J069 Acute upper respiratory infection, unspecified: Secondary | ICD-10-CM | POA: Diagnosis not present

## 2017-01-06 DIAGNOSIS — F3176 Bipolar disorder, in full remission, most recent episode depressed: Secondary | ICD-10-CM | POA: Diagnosis not present

## 2017-01-06 DIAGNOSIS — F3174 Bipolar disorder, in full remission, most recent episode manic: Secondary | ICD-10-CM | POA: Diagnosis not present

## 2017-02-13 DIAGNOSIS — F3131 Bipolar disorder, current episode depressed, mild: Secondary | ICD-10-CM | POA: Diagnosis not present

## 2017-02-13 DIAGNOSIS — F3111 Bipolar disorder, current episode manic without psychotic features, mild: Secondary | ICD-10-CM | POA: Diagnosis not present

## 2017-03-27 DIAGNOSIS — F3174 Bipolar disorder, in full remission, most recent episode manic: Secondary | ICD-10-CM | POA: Diagnosis not present

## 2017-03-27 DIAGNOSIS — F3176 Bipolar disorder, in full remission, most recent episode depressed: Secondary | ICD-10-CM | POA: Diagnosis not present

## 2017-03-28 DIAGNOSIS — M530 Cervicocranial syndrome: Secondary | ICD-10-CM | POA: Diagnosis not present

## 2017-03-28 DIAGNOSIS — G43011 Migraine without aura, intractable, with status migrainosus: Secondary | ICD-10-CM | POA: Diagnosis not present

## 2017-03-28 DIAGNOSIS — R51 Headache: Secondary | ICD-10-CM | POA: Diagnosis not present

## 2017-03-28 DIAGNOSIS — M542 Cervicalgia: Secondary | ICD-10-CM | POA: Diagnosis not present

## 2017-03-28 DIAGNOSIS — G500 Trigeminal neuralgia: Secondary | ICD-10-CM | POA: Diagnosis not present

## 2017-03-28 DIAGNOSIS — M5481 Occipital neuralgia: Secondary | ICD-10-CM | POA: Diagnosis not present

## 2017-05-10 DIAGNOSIS — M25562 Pain in left knee: Secondary | ICD-10-CM | POA: Diagnosis not present

## 2017-05-24 DIAGNOSIS — M25522 Pain in left elbow: Secondary | ICD-10-CM | POA: Diagnosis not present

## 2017-06-01 DIAGNOSIS — E039 Hypothyroidism, unspecified: Secondary | ICD-10-CM | POA: Diagnosis not present

## 2017-06-19 DIAGNOSIS — M25562 Pain in left knee: Secondary | ICD-10-CM | POA: Diagnosis not present

## 2017-06-19 DIAGNOSIS — M1712 Unilateral primary osteoarthritis, left knee: Secondary | ICD-10-CM | POA: Diagnosis not present

## 2017-07-01 DIAGNOSIS — F3174 Bipolar disorder, in full remission, most recent episode manic: Secondary | ICD-10-CM | POA: Diagnosis not present

## 2017-07-01 DIAGNOSIS — F3181 Bipolar II disorder: Secondary | ICD-10-CM | POA: Diagnosis not present

## 2017-07-01 DIAGNOSIS — F4321 Adjustment disorder with depressed mood: Secondary | ICD-10-CM | POA: Diagnosis not present

## 2017-07-01 DIAGNOSIS — F410 Panic disorder [episodic paroxysmal anxiety] without agoraphobia: Secondary | ICD-10-CM | POA: Diagnosis not present

## 2017-07-24 DIAGNOSIS — H5712 Ocular pain, left eye: Secondary | ICD-10-CM | POA: Diagnosis not present

## 2017-08-11 DIAGNOSIS — J988 Other specified respiratory disorders: Secondary | ICD-10-CM | POA: Diagnosis not present

## 2017-08-11 DIAGNOSIS — R21 Rash and other nonspecific skin eruption: Secondary | ICD-10-CM | POA: Diagnosis not present

## 2017-08-14 DIAGNOSIS — F3174 Bipolar disorder, in full remission, most recent episode manic: Secondary | ICD-10-CM | POA: Diagnosis not present

## 2017-08-14 DIAGNOSIS — F3181 Bipolar II disorder: Secondary | ICD-10-CM | POA: Diagnosis not present

## 2017-09-29 DIAGNOSIS — Z111 Encounter for screening for respiratory tuberculosis: Secondary | ICD-10-CM | POA: Diagnosis not present

## 2017-09-29 DIAGNOSIS — Z029 Encounter for administrative examinations, unspecified: Secondary | ICD-10-CM | POA: Diagnosis not present

## 2017-11-02 ENCOUNTER — Other Ambulatory Visit: Payer: Self-pay | Admitting: Physician Assistant

## 2017-11-02 DIAGNOSIS — D485 Neoplasm of uncertain behavior of skin: Secondary | ICD-10-CM | POA: Diagnosis not present

## 2017-11-02 DIAGNOSIS — L309 Dermatitis, unspecified: Secondary | ICD-10-CM | POA: Diagnosis not present

## 2017-11-02 DIAGNOSIS — D229 Melanocytic nevi, unspecified: Secondary | ICD-10-CM | POA: Diagnosis not present

## 2017-11-13 DIAGNOSIS — F3174 Bipolar disorder, in full remission, most recent episode manic: Secondary | ICD-10-CM | POA: Diagnosis not present

## 2017-11-13 DIAGNOSIS — F3176 Bipolar disorder, in full remission, most recent episode depressed: Secondary | ICD-10-CM | POA: Diagnosis not present

## 2017-12-18 DIAGNOSIS — E039 Hypothyroidism, unspecified: Secondary | ICD-10-CM | POA: Diagnosis not present

## 2017-12-18 DIAGNOSIS — F319 Bipolar disorder, unspecified: Secondary | ICD-10-CM | POA: Diagnosis not present

## 2017-12-27 DIAGNOSIS — Z1231 Encounter for screening mammogram for malignant neoplasm of breast: Secondary | ICD-10-CM | POA: Diagnosis not present

## 2017-12-27 DIAGNOSIS — Z6823 Body mass index (BMI) 23.0-23.9, adult: Secondary | ICD-10-CM | POA: Diagnosis not present

## 2017-12-27 DIAGNOSIS — Z01419 Encounter for gynecological examination (general) (routine) without abnormal findings: Secondary | ICD-10-CM | POA: Diagnosis not present

## 2017-12-29 DIAGNOSIS — B351 Tinea unguium: Secondary | ICD-10-CM | POA: Diagnosis not present

## 2017-12-29 DIAGNOSIS — L602 Onychogryphosis: Secondary | ICD-10-CM | POA: Diagnosis not present

## 2017-12-29 DIAGNOSIS — B353 Tinea pedis: Secondary | ICD-10-CM | POA: Diagnosis not present

## 2018-01-30 DIAGNOSIS — H9193 Unspecified hearing loss, bilateral: Secondary | ICD-10-CM | POA: Diagnosis not present

## 2018-02-06 DIAGNOSIS — N941 Unspecified dyspareunia: Secondary | ICD-10-CM | POA: Diagnosis not present

## 2018-02-27 DIAGNOSIS — N941 Unspecified dyspareunia: Secondary | ICD-10-CM | POA: Diagnosis not present

## 2018-02-28 DIAGNOSIS — B351 Tinea unguium: Secondary | ICD-10-CM | POA: Diagnosis not present

## 2018-02-28 DIAGNOSIS — B353 Tinea pedis: Secondary | ICD-10-CM | POA: Diagnosis not present

## 2018-03-14 DIAGNOSIS — G43011 Migraine without aura, intractable, with status migrainosus: Secondary | ICD-10-CM | POA: Diagnosis not present

## 2018-03-14 DIAGNOSIS — R4689 Other symptoms and signs involving appearance and behavior: Secondary | ICD-10-CM | POA: Diagnosis not present

## 2018-03-22 DIAGNOSIS — B351 Tinea unguium: Secondary | ICD-10-CM | POA: Diagnosis not present

## 2018-03-22 DIAGNOSIS — B353 Tinea pedis: Secondary | ICD-10-CM | POA: Diagnosis not present

## 2018-03-22 DIAGNOSIS — L602 Onychogryphosis: Secondary | ICD-10-CM | POA: Diagnosis not present

## 2018-03-22 DIAGNOSIS — N941 Unspecified dyspareunia: Secondary | ICD-10-CM | POA: Diagnosis not present

## 2018-03-29 DIAGNOSIS — N941 Unspecified dyspareunia: Secondary | ICD-10-CM | POA: Diagnosis not present

## 2018-04-06 DIAGNOSIS — M25561 Pain in right knee: Secondary | ICD-10-CM | POA: Diagnosis not present

## 2018-04-06 DIAGNOSIS — M25562 Pain in left knee: Secondary | ICD-10-CM | POA: Diagnosis not present

## 2018-04-20 DIAGNOSIS — M25561 Pain in right knee: Secondary | ICD-10-CM | POA: Diagnosis not present

## 2018-04-20 DIAGNOSIS — M25562 Pain in left knee: Secondary | ICD-10-CM | POA: Diagnosis not present

## 2018-04-27 DIAGNOSIS — M25561 Pain in right knee: Secondary | ICD-10-CM | POA: Diagnosis not present

## 2018-04-27 DIAGNOSIS — M25562 Pain in left knee: Secondary | ICD-10-CM | POA: Diagnosis not present

## 2018-05-10 DIAGNOSIS — F3174 Bipolar disorder, in full remission, most recent episode manic: Secondary | ICD-10-CM | POA: Diagnosis not present

## 2018-05-10 DIAGNOSIS — F3176 Bipolar disorder, in full remission, most recent episode depressed: Secondary | ICD-10-CM | POA: Diagnosis not present

## 2018-06-15 DIAGNOSIS — R51 Headache: Secondary | ICD-10-CM | POA: Diagnosis not present

## 2018-06-18 DIAGNOSIS — F410 Panic disorder [episodic paroxysmal anxiety] without agoraphobia: Secondary | ICD-10-CM | POA: Diagnosis not present

## 2018-06-18 DIAGNOSIS — F3131 Bipolar disorder, current episode depressed, mild: Secondary | ICD-10-CM | POA: Diagnosis not present

## 2018-06-18 DIAGNOSIS — F3111 Bipolar disorder, current episode manic without psychotic features, mild: Secondary | ICD-10-CM | POA: Diagnosis not present

## 2018-07-06 DIAGNOSIS — E785 Hyperlipidemia, unspecified: Secondary | ICD-10-CM | POA: Diagnosis not present

## 2018-07-06 DIAGNOSIS — E1165 Type 2 diabetes mellitus with hyperglycemia: Secondary | ICD-10-CM | POA: Diagnosis not present

## 2018-07-06 DIAGNOSIS — Z79899 Other long term (current) drug therapy: Secondary | ICD-10-CM | POA: Diagnosis not present

## 2018-07-06 DIAGNOSIS — E0590 Thyrotoxicosis, unspecified without thyrotoxic crisis or storm: Secondary | ICD-10-CM | POA: Diagnosis not present

## 2018-07-09 DIAGNOSIS — M9901 Segmental and somatic dysfunction of cervical region: Secondary | ICD-10-CM | POA: Diagnosis not present

## 2018-07-09 DIAGNOSIS — M9902 Segmental and somatic dysfunction of thoracic region: Secondary | ICD-10-CM | POA: Diagnosis not present

## 2018-07-09 DIAGNOSIS — G43909 Migraine, unspecified, not intractable, without status migrainosus: Secondary | ICD-10-CM | POA: Diagnosis not present

## 2018-07-09 DIAGNOSIS — M9904 Segmental and somatic dysfunction of sacral region: Secondary | ICD-10-CM | POA: Diagnosis not present

## 2018-07-16 DIAGNOSIS — G43909 Migraine, unspecified, not intractable, without status migrainosus: Secondary | ICD-10-CM | POA: Diagnosis not present

## 2018-07-16 DIAGNOSIS — M9901 Segmental and somatic dysfunction of cervical region: Secondary | ICD-10-CM | POA: Diagnosis not present

## 2018-07-16 DIAGNOSIS — M9902 Segmental and somatic dysfunction of thoracic region: Secondary | ICD-10-CM | POA: Diagnosis not present

## 2018-07-16 DIAGNOSIS — M9904 Segmental and somatic dysfunction of sacral region: Secondary | ICD-10-CM | POA: Diagnosis not present

## 2018-07-20 DIAGNOSIS — M9901 Segmental and somatic dysfunction of cervical region: Secondary | ICD-10-CM | POA: Diagnosis not present

## 2018-07-20 DIAGNOSIS — M9904 Segmental and somatic dysfunction of sacral region: Secondary | ICD-10-CM | POA: Diagnosis not present

## 2018-07-20 DIAGNOSIS — M9902 Segmental and somatic dysfunction of thoracic region: Secondary | ICD-10-CM | POA: Diagnosis not present

## 2018-07-20 DIAGNOSIS — G43909 Migraine, unspecified, not intractable, without status migrainosus: Secondary | ICD-10-CM | POA: Diagnosis not present

## 2018-07-25 DIAGNOSIS — G43909 Migraine, unspecified, not intractable, without status migrainosus: Secondary | ICD-10-CM | POA: Diagnosis not present

## 2018-07-25 DIAGNOSIS — M9902 Segmental and somatic dysfunction of thoracic region: Secondary | ICD-10-CM | POA: Diagnosis not present

## 2018-07-25 DIAGNOSIS — M9904 Segmental and somatic dysfunction of sacral region: Secondary | ICD-10-CM | POA: Diagnosis not present

## 2018-07-25 DIAGNOSIS — M9901 Segmental and somatic dysfunction of cervical region: Secondary | ICD-10-CM | POA: Diagnosis not present

## 2018-07-30 DIAGNOSIS — F3176 Bipolar disorder, in full remission, most recent episode depressed: Secondary | ICD-10-CM | POA: Diagnosis not present

## 2018-07-30 DIAGNOSIS — F3174 Bipolar disorder, in full remission, most recent episode manic: Secondary | ICD-10-CM | POA: Diagnosis not present

## 2018-08-01 ENCOUNTER — Other Ambulatory Visit: Payer: Self-pay | Admitting: Physician Assistant

## 2018-08-01 DIAGNOSIS — D485 Neoplasm of uncertain behavior of skin: Secondary | ICD-10-CM | POA: Diagnosis not present

## 2018-08-01 DIAGNOSIS — D229 Melanocytic nevi, unspecified: Secondary | ICD-10-CM | POA: Diagnosis not present

## 2018-08-01 DIAGNOSIS — L910 Hypertrophic scar: Secondary | ICD-10-CM | POA: Diagnosis not present

## 2018-08-03 DIAGNOSIS — M9901 Segmental and somatic dysfunction of cervical region: Secondary | ICD-10-CM | POA: Diagnosis not present

## 2018-08-03 DIAGNOSIS — M9904 Segmental and somatic dysfunction of sacral region: Secondary | ICD-10-CM | POA: Diagnosis not present

## 2018-08-03 DIAGNOSIS — M9902 Segmental and somatic dysfunction of thoracic region: Secondary | ICD-10-CM | POA: Diagnosis not present

## 2018-08-03 DIAGNOSIS — G43909 Migraine, unspecified, not intractable, without status migrainosus: Secondary | ICD-10-CM | POA: Diagnosis not present

## 2018-08-10 DIAGNOSIS — M9902 Segmental and somatic dysfunction of thoracic region: Secondary | ICD-10-CM | POA: Diagnosis not present

## 2018-08-10 DIAGNOSIS — M9904 Segmental and somatic dysfunction of sacral region: Secondary | ICD-10-CM | POA: Diagnosis not present

## 2018-08-10 DIAGNOSIS — M9901 Segmental and somatic dysfunction of cervical region: Secondary | ICD-10-CM | POA: Diagnosis not present

## 2018-08-10 DIAGNOSIS — G43909 Migraine, unspecified, not intractable, without status migrainosus: Secondary | ICD-10-CM | POA: Diagnosis not present

## 2018-08-30 ENCOUNTER — Other Ambulatory Visit: Payer: Self-pay | Admitting: Physician Assistant

## 2018-08-30 DIAGNOSIS — D2361 Other benign neoplasm of skin of right upper limb, including shoulder: Secondary | ICD-10-CM | POA: Diagnosis not present

## 2018-09-10 DIAGNOSIS — M9904 Segmental and somatic dysfunction of sacral region: Secondary | ICD-10-CM | POA: Diagnosis not present

## 2018-09-10 DIAGNOSIS — G43909 Migraine, unspecified, not intractable, without status migrainosus: Secondary | ICD-10-CM | POA: Diagnosis not present

## 2018-09-10 DIAGNOSIS — M9901 Segmental and somatic dysfunction of cervical region: Secondary | ICD-10-CM | POA: Diagnosis not present

## 2018-09-10 DIAGNOSIS — E039 Hypothyroidism, unspecified: Secondary | ICD-10-CM | POA: Diagnosis not present

## 2018-09-10 DIAGNOSIS — M9902 Segmental and somatic dysfunction of thoracic region: Secondary | ICD-10-CM | POA: Diagnosis not present

## 2018-09-12 ENCOUNTER — Other Ambulatory Visit: Payer: Self-pay | Admitting: Physician Assistant

## 2018-09-12 DIAGNOSIS — D2372 Other benign neoplasm of skin of left lower limb, including hip: Secondary | ICD-10-CM | POA: Diagnosis not present

## 2018-09-14 ENCOUNTER — Other Ambulatory Visit: Payer: Self-pay | Admitting: Chiropractic Medicine

## 2018-09-14 ENCOUNTER — Ambulatory Visit
Admission: RE | Admit: 2018-09-14 | Discharge: 2018-09-14 | Disposition: A | Discharge status: Home / Self Care | Payer: BLUE CROSS/BLUE SHIELD | Source: Ambulatory Visit | Attending: Chiropractic Medicine | Admitting: Chiropractic Medicine

## 2018-09-14 DIAGNOSIS — G8929 Other chronic pain: Secondary | ICD-10-CM

## 2018-09-14 DIAGNOSIS — G43909 Migraine, unspecified, not intractable, without status migrainosus: Secondary | ICD-10-CM | POA: Diagnosis not present

## 2018-09-14 DIAGNOSIS — M25551 Pain in right hip: Secondary | ICD-10-CM | POA: Diagnosis not present

## 2018-09-14 DIAGNOSIS — M9901 Segmental and somatic dysfunction of cervical region: Secondary | ICD-10-CM | POA: Diagnosis not present

## 2018-09-14 DIAGNOSIS — M9904 Segmental and somatic dysfunction of sacral region: Secondary | ICD-10-CM | POA: Diagnosis not present

## 2018-09-14 DIAGNOSIS — M9902 Segmental and somatic dysfunction of thoracic region: Secondary | ICD-10-CM | POA: Diagnosis not present

## 2018-10-23 DIAGNOSIS — M533 Sacrococcygeal disorders, not elsewhere classified: Secondary | ICD-10-CM | POA: Diagnosis not present

## 2018-10-23 DIAGNOSIS — M79672 Pain in left foot: Secondary | ICD-10-CM | POA: Diagnosis not present

## 2018-10-23 DIAGNOSIS — M25561 Pain in right knee: Secondary | ICD-10-CM | POA: Diagnosis not present

## 2018-10-23 DIAGNOSIS — M79642 Pain in left hand: Secondary | ICD-10-CM | POA: Diagnosis not present

## 2018-10-23 DIAGNOSIS — M79671 Pain in right foot: Secondary | ICD-10-CM | POA: Diagnosis not present

## 2018-10-23 DIAGNOSIS — M2550 Pain in unspecified joint: Secondary | ICD-10-CM | POA: Diagnosis not present

## 2018-10-23 DIAGNOSIS — M545 Low back pain: Secondary | ICD-10-CM | POA: Diagnosis not present

## 2018-10-23 DIAGNOSIS — R682 Dry mouth, unspecified: Secondary | ICD-10-CM | POA: Diagnosis not present

## 2018-10-23 DIAGNOSIS — M549 Dorsalgia, unspecified: Secondary | ICD-10-CM | POA: Diagnosis not present

## 2018-10-23 DIAGNOSIS — M79641 Pain in right hand: Secondary | ICD-10-CM | POA: Diagnosis not present

## 2018-10-23 DIAGNOSIS — M25551 Pain in right hip: Secondary | ICD-10-CM | POA: Diagnosis not present

## 2018-10-23 DIAGNOSIS — M25562 Pain in left knee: Secondary | ICD-10-CM | POA: Diagnosis not present

## 2018-11-09 DIAGNOSIS — M549 Dorsalgia, unspecified: Secondary | ICD-10-CM | POA: Diagnosis not present

## 2018-11-09 DIAGNOSIS — M2550 Pain in unspecified joint: Secondary | ICD-10-CM | POA: Diagnosis not present

## 2018-11-09 DIAGNOSIS — M25551 Pain in right hip: Secondary | ICD-10-CM | POA: Diagnosis not present

## 2018-11-09 DIAGNOSIS — R682 Dry mouth, unspecified: Secondary | ICD-10-CM | POA: Diagnosis not present

## 2019-01-16 DIAGNOSIS — E039 Hypothyroidism, unspecified: Secondary | ICD-10-CM | POA: Diagnosis not present

## 2019-01-21 DIAGNOSIS — F3174 Bipolar disorder, in full remission, most recent episode manic: Secondary | ICD-10-CM | POA: Diagnosis not present

## 2019-01-21 DIAGNOSIS — F3176 Bipolar disorder, in full remission, most recent episode depressed: Secondary | ICD-10-CM | POA: Diagnosis not present

## 2019-01-29 DIAGNOSIS — E785 Hyperlipidemia, unspecified: Secondary | ICD-10-CM | POA: Diagnosis not present

## 2019-01-29 DIAGNOSIS — Z309 Encounter for contraceptive management, unspecified: Secondary | ICD-10-CM | POA: Diagnosis not present

## 2019-01-29 DIAGNOSIS — Z79899 Other long term (current) drug therapy: Secondary | ICD-10-CM | POA: Diagnosis not present

## 2019-01-29 DIAGNOSIS — R7309 Other abnormal glucose: Secondary | ICD-10-CM | POA: Diagnosis not present

## 2019-02-12 DIAGNOSIS — Z6826 Body mass index (BMI) 26.0-26.9, adult: Secondary | ICD-10-CM | POA: Diagnosis not present

## 2019-02-12 DIAGNOSIS — Z1231 Encounter for screening mammogram for malignant neoplasm of breast: Secondary | ICD-10-CM | POA: Diagnosis not present

## 2019-02-12 DIAGNOSIS — Z01419 Encounter for gynecological examination (general) (routine) without abnormal findings: Secondary | ICD-10-CM | POA: Diagnosis not present

## 2019-02-19 DIAGNOSIS — M25551 Pain in right hip: Secondary | ICD-10-CM | POA: Diagnosis not present

## 2019-02-19 DIAGNOSIS — M2550 Pain in unspecified joint: Secondary | ICD-10-CM | POA: Diagnosis not present

## 2019-02-19 DIAGNOSIS — M549 Dorsalgia, unspecified: Secondary | ICD-10-CM | POA: Diagnosis not present

## 2019-02-19 DIAGNOSIS — R682 Dry mouth, unspecified: Secondary | ICD-10-CM | POA: Diagnosis not present

## 2019-04-09 DIAGNOSIS — Z1211 Encounter for screening for malignant neoplasm of colon: Secondary | ICD-10-CM | POA: Diagnosis not present

## 2019-04-09 DIAGNOSIS — Z111 Encounter for screening for respiratory tuberculosis: Secondary | ICD-10-CM | POA: Diagnosis not present

## 2019-04-22 DIAGNOSIS — Z111 Encounter for screening for respiratory tuberculosis: Secondary | ICD-10-CM | POA: Diagnosis not present

## 2019-05-21 DIAGNOSIS — R51 Headache: Secondary | ICD-10-CM | POA: Diagnosis not present

## 2019-05-21 DIAGNOSIS — M542 Cervicalgia: Secondary | ICD-10-CM | POA: Diagnosis not present

## 2019-05-21 DIAGNOSIS — G43011 Migraine without aura, intractable, with status migrainosus: Secondary | ICD-10-CM | POA: Diagnosis not present

## 2019-05-21 DIAGNOSIS — F319 Bipolar disorder, unspecified: Secondary | ICD-10-CM | POA: Diagnosis not present

## 2019-06-26 ENCOUNTER — Other Ambulatory Visit: Payer: Self-pay | Admitting: Physician Assistant

## 2019-06-26 DIAGNOSIS — D229 Melanocytic nevi, unspecified: Secondary | ICD-10-CM | POA: Diagnosis not present

## 2019-06-26 DIAGNOSIS — L728 Other follicular cysts of the skin and subcutaneous tissue: Secondary | ICD-10-CM | POA: Diagnosis not present

## 2019-06-26 DIAGNOSIS — D485 Neoplasm of uncertain behavior of skin: Secondary | ICD-10-CM | POA: Diagnosis not present

## 2019-06-26 DIAGNOSIS — L918 Other hypertrophic disorders of the skin: Secondary | ICD-10-CM | POA: Diagnosis not present

## 2019-06-26 DIAGNOSIS — D225 Melanocytic nevi of trunk: Secondary | ICD-10-CM | POA: Diagnosis not present

## 2019-07-24 DIAGNOSIS — M549 Dorsalgia, unspecified: Secondary | ICD-10-CM | POA: Diagnosis not present

## 2019-07-24 DIAGNOSIS — R682 Dry mouth, unspecified: Secondary | ICD-10-CM | POA: Diagnosis not present

## 2019-07-24 DIAGNOSIS — M2550 Pain in unspecified joint: Secondary | ICD-10-CM | POA: Diagnosis not present

## 2019-07-24 DIAGNOSIS — M25551 Pain in right hip: Secondary | ICD-10-CM | POA: Diagnosis not present

## 2019-07-31 DIAGNOSIS — M25559 Pain in unspecified hip: Secondary | ICD-10-CM | POA: Diagnosis not present

## 2019-08-02 DIAGNOSIS — M25559 Pain in unspecified hip: Secondary | ICD-10-CM | POA: Diagnosis not present

## 2019-08-06 DIAGNOSIS — M25559 Pain in unspecified hip: Secondary | ICD-10-CM | POA: Diagnosis not present

## 2019-08-07 ENCOUNTER — Other Ambulatory Visit: Payer: Self-pay | Admitting: Dermatology

## 2019-08-07 DIAGNOSIS — D235 Other benign neoplasm of skin of trunk: Secondary | ICD-10-CM | POA: Diagnosis not present

## 2019-08-08 DIAGNOSIS — M25559 Pain in unspecified hip: Secondary | ICD-10-CM | POA: Diagnosis not present

## 2019-08-13 DIAGNOSIS — M25559 Pain in unspecified hip: Secondary | ICD-10-CM | POA: Diagnosis not present

## 2019-08-15 DIAGNOSIS — M25559 Pain in unspecified hip: Secondary | ICD-10-CM | POA: Diagnosis not present

## 2019-08-20 DIAGNOSIS — M25559 Pain in unspecified hip: Secondary | ICD-10-CM | POA: Diagnosis not present

## 2019-08-21 DIAGNOSIS — F3174 Bipolar disorder, in full remission, most recent episode manic: Secondary | ICD-10-CM | POA: Diagnosis not present

## 2019-08-21 DIAGNOSIS — F3176 Bipolar disorder, in full remission, most recent episode depressed: Secondary | ICD-10-CM | POA: Diagnosis not present

## 2019-08-21 DIAGNOSIS — F410 Panic disorder [episodic paroxysmal anxiety] without agoraphobia: Secondary | ICD-10-CM | POA: Diagnosis not present

## 2019-08-23 DIAGNOSIS — M25559 Pain in unspecified hip: Secondary | ICD-10-CM | POA: Diagnosis not present

## 2019-08-26 DIAGNOSIS — M25559 Pain in unspecified hip: Secondary | ICD-10-CM | POA: Diagnosis not present

## 2019-08-28 DIAGNOSIS — M25559 Pain in unspecified hip: Secondary | ICD-10-CM | POA: Diagnosis not present

## 2019-09-03 DIAGNOSIS — M25559 Pain in unspecified hip: Secondary | ICD-10-CM | POA: Diagnosis not present

## 2019-09-09 DIAGNOSIS — M25559 Pain in unspecified hip: Secondary | ICD-10-CM | POA: Diagnosis not present

## 2019-09-11 DIAGNOSIS — M25559 Pain in unspecified hip: Secondary | ICD-10-CM | POA: Diagnosis not present

## 2019-09-16 DIAGNOSIS — M25559 Pain in unspecified hip: Secondary | ICD-10-CM | POA: Diagnosis not present

## 2019-09-18 DIAGNOSIS — M25559 Pain in unspecified hip: Secondary | ICD-10-CM | POA: Diagnosis not present

## 2019-09-23 DIAGNOSIS — M25559 Pain in unspecified hip: Secondary | ICD-10-CM | POA: Diagnosis not present

## 2019-09-27 DIAGNOSIS — E039 Hypothyroidism, unspecified: Secondary | ICD-10-CM | POA: Diagnosis not present

## 2019-09-30 DIAGNOSIS — M25559 Pain in unspecified hip: Secondary | ICD-10-CM | POA: Diagnosis not present

## 2019-10-02 DIAGNOSIS — M25559 Pain in unspecified hip: Secondary | ICD-10-CM | POA: Diagnosis not present

## 2019-10-08 DIAGNOSIS — M25559 Pain in unspecified hip: Secondary | ICD-10-CM | POA: Diagnosis not present

## 2019-10-10 DIAGNOSIS — M25559 Pain in unspecified hip: Secondary | ICD-10-CM | POA: Diagnosis not present

## 2019-10-16 DIAGNOSIS — M25559 Pain in unspecified hip: Secondary | ICD-10-CM | POA: Diagnosis not present

## 2019-10-21 DIAGNOSIS — M25559 Pain in unspecified hip: Secondary | ICD-10-CM | POA: Diagnosis not present

## 2019-10-23 DIAGNOSIS — E039 Hypothyroidism, unspecified: Secondary | ICD-10-CM | POA: Diagnosis not present

## 2019-10-24 DIAGNOSIS — M25559 Pain in unspecified hip: Secondary | ICD-10-CM | POA: Diagnosis not present

## 2019-10-28 DIAGNOSIS — M25559 Pain in unspecified hip: Secondary | ICD-10-CM | POA: Diagnosis not present

## 2019-10-31 DIAGNOSIS — M25559 Pain in unspecified hip: Secondary | ICD-10-CM | POA: Diagnosis not present

## 2019-11-05 DIAGNOSIS — M25559 Pain in unspecified hip: Secondary | ICD-10-CM | POA: Diagnosis not present

## 2019-12-03 DIAGNOSIS — F3174 Bipolar disorder, in full remission, most recent episode manic: Secondary | ICD-10-CM | POA: Diagnosis not present

## 2019-12-03 DIAGNOSIS — F3176 Bipolar disorder, in full remission, most recent episode depressed: Secondary | ICD-10-CM | POA: Diagnosis not present

## 2020-01-29 DIAGNOSIS — F3176 Bipolar disorder, in full remission, most recent episode depressed: Secondary | ICD-10-CM | POA: Diagnosis not present

## 2020-01-29 DIAGNOSIS — F3174 Bipolar disorder, in full remission, most recent episode manic: Secondary | ICD-10-CM | POA: Diagnosis not present

## 2020-02-17 DIAGNOSIS — Z1231 Encounter for screening mammogram for malignant neoplasm of breast: Secondary | ICD-10-CM | POA: Diagnosis not present

## 2020-02-17 DIAGNOSIS — N952 Postmenopausal atrophic vaginitis: Secondary | ICD-10-CM | POA: Diagnosis not present

## 2020-02-17 DIAGNOSIS — Z6826 Body mass index (BMI) 26.0-26.9, adult: Secondary | ICD-10-CM | POA: Diagnosis not present

## 2020-02-17 DIAGNOSIS — Z01419 Encounter for gynecological examination (general) (routine) without abnormal findings: Secondary | ICD-10-CM | POA: Diagnosis not present

## 2020-04-20 DIAGNOSIS — E039 Hypothyroidism, unspecified: Secondary | ICD-10-CM | POA: Diagnosis not present

## 2020-04-22 DIAGNOSIS — F319 Bipolar disorder, unspecified: Secondary | ICD-10-CM | POA: Diagnosis not present

## 2020-04-22 DIAGNOSIS — E039 Hypothyroidism, unspecified: Secondary | ICD-10-CM | POA: Diagnosis not present

## 2020-05-01 ENCOUNTER — Ambulatory Visit: Payer: BLUE CROSS/BLUE SHIELD | Admitting: Physician Assistant

## 2020-06-10 ENCOUNTER — Encounter: Payer: Self-pay | Admitting: Physician Assistant

## 2020-06-10 ENCOUNTER — Other Ambulatory Visit: Payer: Self-pay

## 2020-06-10 ENCOUNTER — Other Ambulatory Visit: Payer: Self-pay | Admitting: Physician Assistant

## 2020-06-10 ENCOUNTER — Ambulatory Visit: Payer: BC Managed Care – PPO | Admitting: Physician Assistant

## 2020-06-10 DIAGNOSIS — D225 Melanocytic nevi of trunk: Secondary | ICD-10-CM | POA: Diagnosis not present

## 2020-06-10 DIAGNOSIS — Z86018 Personal history of other benign neoplasm: Secondary | ICD-10-CM

## 2020-06-10 DIAGNOSIS — L905 Scar conditions and fibrosis of skin: Secondary | ICD-10-CM | POA: Diagnosis not present

## 2020-06-10 DIAGNOSIS — Z1283 Encounter for screening for malignant neoplasm of skin: Secondary | ICD-10-CM

## 2020-06-10 DIAGNOSIS — D2262 Melanocytic nevi of left upper limb, including shoulder: Secondary | ICD-10-CM | POA: Diagnosis not present

## 2020-06-10 DIAGNOSIS — Z87898 Personal history of other specified conditions: Secondary | ICD-10-CM

## 2020-06-10 DIAGNOSIS — D2261 Melanocytic nevi of right upper limb, including shoulder: Secondary | ICD-10-CM | POA: Diagnosis not present

## 2020-06-10 DIAGNOSIS — D485 Neoplasm of uncertain behavior of skin: Secondary | ICD-10-CM | POA: Diagnosis not present

## 2020-06-10 NOTE — Patient Instructions (Signed)

## 2020-06-10 NOTE — Progress Notes (Signed)
   Follow up Visit  Subjective  Patricia Adkins is a 49 y.o. female who presents for the following: Annual Exam (skin check). She feels there is one on her back that looks different.  Objective  Well appearing patient in no apparent distress; mood and affect are within normal limits.  A full examination was performed including scalp, head, eyes, ears, nose, lips, neck, chest, axillae, abdomen, back, buttocks, bilateral upper extremities, bilateral lower extremities, hands, feet, fingers, toes, fingernails, and toenails. All findings within normal limits unless otherwise noted below.   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  Objective  Right Upper Back: Brown macule irreguar shape and color       Objective  Left Upper Arm - Anterior: Brown papule irregular color     Objective  Right Upper Arm - Posterior: New inflamed macule     Objective  Neck - Anterior: Total body skin exam  Assessment & Plan  History of atypical nevus Right Lower Back  Neoplasm of uncertain behavior of skin (3) Right Upper Back  Skin / nail biopsy Type of biopsy: tangential   Informed consent: discussed and consent obtained   Timeout: patient name, date of birth, surgical site, and procedure verified   Procedure prep:  Patient was prepped and draped in usual sterile fashion (Non sterile) Prep type:  Chlorhexidine Anesthesia: the lesion was anesthetized in a standard fashion   Anesthetic:  1% lidocaine w/ epinephrine 1-100,000 local infiltration Instrument used: flexible razor blade   Outcome: patient tolerated procedure well   Post-procedure details: wound care instructions given    Specimen 1 - Surgical pathology Differential Diagnosis: r/o atypia Check Margins: No  Left Upper Arm - Anterior  Skin / nail biopsy Type of biopsy: tangential   Informed consent: discussed and consent obtained   Timeout: patient name, date of birth, surgical site, and  procedure verified   Procedure prep:  Patient was prepped and draped in usual sterile fashion (Non sterile) Prep type:  Chlorhexidine Anesthesia: the lesion was anesthetized in a standard fashion   Anesthetic:  1% lidocaine w/ epinephrine 1-100,000 local infiltration Instrument used: flexible razor blade   Outcome: patient tolerated procedure well   Post-procedure details: wound care instructions given    Specimen 2 - Surgical pathology Differential Diagnosis: r/o atypia Check Margins: No  Right Upper Arm - Posterior  Skin / nail biopsy Type of biopsy: tangential   Informed consent: discussed and consent obtained   Timeout: patient name, date of birth, surgical site, and procedure verified   Procedure prep:  Patient was prepped and draped in usual sterile fashion (Non sterile) Prep type:  Chlorhexidine Anesthesia: the lesion was anesthetized in a standard fashion   Anesthetic:  1% lidocaine w/ epinephrine 1-100,000 local infiltration Instrument used: flexible razor blade   Outcome: patient tolerated procedure well   Post-procedure details: wound care instructions given    Specimen 3 - Surgical pathology Differential Diagnosis: r/o atypia Check Margins: No  Skin exam for malignant neoplasm Neck - Anterior

## 2020-06-17 ENCOUNTER — Telehealth: Payer: Self-pay

## 2020-06-17 NOTE — Telephone Encounter (Signed)
-----   Message from Arlyss Gandy, [place] sent at 06/17/2020  8:01 AM EDT ----- 6 mos recheck

## 2020-06-17 NOTE — Telephone Encounter (Signed)
Pathology given to patient 74mo f/u needed she will call back.

## 2020-06-30 DIAGNOSIS — Z1211 Encounter for screening for malignant neoplasm of colon: Secondary | ICD-10-CM | POA: Diagnosis not present

## 2020-06-30 DIAGNOSIS — R1013 Epigastric pain: Secondary | ICD-10-CM | POA: Diagnosis not present

## 2020-07-02 ENCOUNTER — Other Ambulatory Visit: Payer: Self-pay | Admitting: Family Medicine

## 2020-07-02 DIAGNOSIS — R1013 Epigastric pain: Secondary | ICD-10-CM

## 2020-07-06 DIAGNOSIS — N289 Disorder of kidney and ureter, unspecified: Secondary | ICD-10-CM | POA: Diagnosis not present

## 2020-07-09 ENCOUNTER — Ambulatory Visit
Admission: RE | Admit: 2020-07-09 | Discharge: 2020-07-09 | Disposition: A | Discharge status: Home / Self Care | Payer: BC Managed Care – PPO | Source: Ambulatory Visit | Attending: Family Medicine | Admitting: Family Medicine

## 2020-07-09 ENCOUNTER — Other Ambulatory Visit: Payer: Self-pay | Admitting: Family Medicine

## 2020-07-09 DIAGNOSIS — R1013 Epigastric pain: Secondary | ICD-10-CM

## 2020-07-09 DIAGNOSIS — R1010 Upper abdominal pain, unspecified: Secondary | ICD-10-CM | POA: Diagnosis not present

## 2020-07-09 DIAGNOSIS — N289 Disorder of kidney and ureter, unspecified: Secondary | ICD-10-CM

## 2020-07-13 DIAGNOSIS — Z01818 Encounter for other preprocedural examination: Secondary | ICD-10-CM | POA: Diagnosis not present

## 2020-07-13 DIAGNOSIS — R1013 Epigastric pain: Secondary | ICD-10-CM | POA: Diagnosis not present

## 2020-07-15 ENCOUNTER — Ambulatory Visit (INDEPENDENT_AMBULATORY_CARE_PROVIDER_SITE_OTHER): Payer: BC Managed Care – PPO

## 2020-07-15 ENCOUNTER — Other Ambulatory Visit: Payer: Self-pay

## 2020-07-15 DIAGNOSIS — N289 Disorder of kidney and ureter, unspecified: Secondary | ICD-10-CM

## 2020-07-15 DIAGNOSIS — R944 Abnormal results of kidney function studies: Secondary | ICD-10-CM | POA: Diagnosis not present

## 2020-07-17 DIAGNOSIS — Z1211 Encounter for screening for malignant neoplasm of colon: Secondary | ICD-10-CM | POA: Diagnosis not present

## 2020-08-03 DIAGNOSIS — N1831 Chronic kidney disease, stage 3a: Secondary | ICD-10-CM | POA: Diagnosis not present

## 2020-08-03 DIAGNOSIS — N250 Renal osteodystrophy: Secondary | ICD-10-CM | POA: Diagnosis not present

## 2020-08-03 DIAGNOSIS — D649 Anemia, unspecified: Secondary | ICD-10-CM | POA: Diagnosis not present

## 2020-08-28 DIAGNOSIS — F4322 Adjustment disorder with anxiety: Secondary | ICD-10-CM | POA: Diagnosis not present

## 2020-08-28 DIAGNOSIS — F3176 Bipolar disorder, in full remission, most recent episode depressed: Secondary | ICD-10-CM | POA: Diagnosis not present

## 2020-08-28 DIAGNOSIS — F410 Panic disorder [episodic paroxysmal anxiety] without agoraphobia: Secondary | ICD-10-CM | POA: Diagnosis not present

## 2020-08-28 DIAGNOSIS — F3174 Bipolar disorder, in full remission, most recent episode manic: Secondary | ICD-10-CM | POA: Diagnosis not present

## 2020-08-28 DIAGNOSIS — Z1159 Encounter for screening for other viral diseases: Secondary | ICD-10-CM | POA: Diagnosis not present

## 2020-09-02 DIAGNOSIS — K2930 Chronic superficial gastritis without bleeding: Secondary | ICD-10-CM | POA: Diagnosis not present

## 2020-09-02 DIAGNOSIS — R1013 Epigastric pain: Secondary | ICD-10-CM | POA: Diagnosis not present

## 2020-10-06 DIAGNOSIS — Z20822 Contact with and (suspected) exposure to covid-19: Secondary | ICD-10-CM | POA: Diagnosis not present

## 2020-10-28 DIAGNOSIS — R197 Diarrhea, unspecified: Secondary | ICD-10-CM | POA: Diagnosis not present

## 2020-10-30 DIAGNOSIS — Z01812 Encounter for preprocedural laboratory examination: Secondary | ICD-10-CM | POA: Diagnosis not present

## 2020-11-03 DIAGNOSIS — R1013 Epigastric pain: Secondary | ICD-10-CM | POA: Diagnosis not present

## 2020-11-03 DIAGNOSIS — R197 Diarrhea, unspecified: Secondary | ICD-10-CM | POA: Diagnosis not present

## 2020-11-03 DIAGNOSIS — K2970 Gastritis, unspecified, without bleeding: Secondary | ICD-10-CM | POA: Diagnosis not present

## 2020-11-03 DIAGNOSIS — K52832 Lymphocytic colitis: Secondary | ICD-10-CM | POA: Diagnosis not present

## 2020-11-11 DIAGNOSIS — E039 Hypothyroidism, unspecified: Secondary | ICD-10-CM | POA: Diagnosis not present

## 2020-11-13 DIAGNOSIS — F319 Bipolar disorder, unspecified: Secondary | ICD-10-CM | POA: Diagnosis not present

## 2020-11-13 DIAGNOSIS — E049 Nontoxic goiter, unspecified: Secondary | ICD-10-CM | POA: Diagnosis not present

## 2020-11-13 DIAGNOSIS — E039 Hypothyroidism, unspecified: Secondary | ICD-10-CM | POA: Diagnosis not present

## 2020-11-18 DIAGNOSIS — K5289 Other specified noninfective gastroenteritis and colitis: Secondary | ICD-10-CM | POA: Diagnosis not present

## 2020-12-12 DIAGNOSIS — N390 Urinary tract infection, site not specified: Secondary | ICD-10-CM | POA: Diagnosis not present

## 2021-02-03 DIAGNOSIS — N1831 Chronic kidney disease, stage 3a: Secondary | ICD-10-CM | POA: Diagnosis not present

## 2021-02-03 DIAGNOSIS — N250 Renal osteodystrophy: Secondary | ICD-10-CM | POA: Diagnosis not present

## 2021-02-03 DIAGNOSIS — D649 Anemia, unspecified: Secondary | ICD-10-CM | POA: Diagnosis not present

## 2021-02-10 DIAGNOSIS — F3174 Bipolar disorder, in full remission, most recent episode manic: Secondary | ICD-10-CM | POA: Diagnosis not present

## 2021-02-10 DIAGNOSIS — F3176 Bipolar disorder, in full remission, most recent episode depressed: Secondary | ICD-10-CM | POA: Diagnosis not present

## 2021-02-10 DIAGNOSIS — F410 Panic disorder [episodic paroxysmal anxiety] without agoraphobia: Secondary | ICD-10-CM | POA: Diagnosis not present

## 2021-02-22 ENCOUNTER — Ambulatory Visit: Payer: BC Managed Care – PPO | Admitting: Dermatology

## 2021-02-24 DIAGNOSIS — Z01419 Encounter for gynecological examination (general) (routine) without abnormal findings: Secondary | ICD-10-CM | POA: Diagnosis not present

## 2021-02-24 DIAGNOSIS — Z6827 Body mass index (BMI) 27.0-27.9, adult: Secondary | ICD-10-CM | POA: Diagnosis not present

## 2021-02-24 DIAGNOSIS — Z1231 Encounter for screening mammogram for malignant neoplasm of breast: Secondary | ICD-10-CM | POA: Diagnosis not present

## 2021-04-07 ENCOUNTER — Other Ambulatory Visit: Payer: Self-pay

## 2021-04-07 ENCOUNTER — Ambulatory Visit: Payer: BC Managed Care – PPO | Admitting: Dermatology

## 2021-04-07 DIAGNOSIS — Z86018 Personal history of other benign neoplasm: Secondary | ICD-10-CM

## 2021-04-07 DIAGNOSIS — Z1283 Encounter for screening for malignant neoplasm of skin: Secondary | ICD-10-CM | POA: Diagnosis not present

## 2021-04-07 DIAGNOSIS — Z87898 Personal history of other specified conditions: Secondary | ICD-10-CM

## 2021-04-25 ENCOUNTER — Encounter: Payer: Self-pay | Admitting: Dermatology

## 2021-04-25 NOTE — Progress Notes (Signed)
   Follow-Up Visit   Subjective  Patricia Adkins is a 50 y.o. female who presents for the following: Annual Exam (Patient is here annual skin exam. Patient as a lesion on left arm that is worrying her. X months. History atypical moles. ).  General skin examination in young woman with history of atypical moles Location:  Duration:  Quality:  Associated Signs/Symptoms: Modifying Factors:  Severity:  Timing: Context:   Objective  Well appearing patient in no apparent distress; mood and affect are within normal limits. Left Forearm - Posterior Lesion left arm shows no dermoscopic atypical features; photograph taken.     Mid Back General skin examination, no atypical or recurrent nevi.  All pigmented lesions checked with dermoscopy.    A full examination was performed including scalp, head, eyes, ears, nose, lips, neck, chest, axillae, abdomen, back, buttocks, bilateral upper extremities, bilateral lower extremities, hands, feet, fingers, toes, fingernails, and toenails. All findings within normal limits unless otherwise noted below.  Areas beneath undergarments not fully examined.   Assessment & Plan    History of atypical nevus Left Forearm - Posterior  I offered to obtain confirmatory shave biopsy but patient content to leave lesion unless there is clinical change.  Encounter for screening for malignant neoplasm of skin Mid Back  Annual skin examination, encouraged to self examine twice annually.  Continued ultraviolet protection.      I, Lavonna Monarch, MD, have reviewed all documentation for this visit.  The documentation on 04/25/21 for the exam, diagnosis, procedures, and orders are all accurate and complete.

## 2021-05-17 DIAGNOSIS — E039 Hypothyroidism, unspecified: Secondary | ICD-10-CM | POA: Diagnosis not present

## 2021-05-21 DIAGNOSIS — E039 Hypothyroidism, unspecified: Secondary | ICD-10-CM | POA: Diagnosis not present

## 2021-05-24 DIAGNOSIS — M25559 Pain in unspecified hip: Secondary | ICD-10-CM | POA: Diagnosis not present

## 2021-05-27 DIAGNOSIS — M25552 Pain in left hip: Secondary | ICD-10-CM | POA: Diagnosis not present

## 2021-05-28 DIAGNOSIS — N390 Urinary tract infection, site not specified: Secondary | ICD-10-CM | POA: Diagnosis not present

## 2021-06-30 DIAGNOSIS — M25552 Pain in left hip: Secondary | ICD-10-CM | POA: Diagnosis not present

## 2021-06-30 DIAGNOSIS — M25551 Pain in right hip: Secondary | ICD-10-CM | POA: Diagnosis not present

## 2021-07-12 DIAGNOSIS — G43011 Migraine without aura, intractable, with status migrainosus: Secondary | ICD-10-CM | POA: Diagnosis not present

## 2021-07-12 DIAGNOSIS — R5383 Other fatigue: Secondary | ICD-10-CM | POA: Diagnosis not present

## 2021-08-06 DIAGNOSIS — F410 Panic disorder [episodic paroxysmal anxiety] without agoraphobia: Secondary | ICD-10-CM | POA: Diagnosis not present

## 2021-08-06 DIAGNOSIS — F3132 Bipolar disorder, current episode depressed, moderate: Secondary | ICD-10-CM | POA: Diagnosis not present

## 2021-09-15 DIAGNOSIS — D649 Anemia, unspecified: Secondary | ICD-10-CM | POA: Diagnosis not present

## 2021-09-15 DIAGNOSIS — N1831 Chronic kidney disease, stage 3a: Secondary | ICD-10-CM | POA: Diagnosis not present

## 2021-09-15 DIAGNOSIS — N250 Renal osteodystrophy: Secondary | ICD-10-CM | POA: Diagnosis not present

## 2021-09-20 DIAGNOSIS — F410 Panic disorder [episodic paroxysmal anxiety] without agoraphobia: Secondary | ICD-10-CM | POA: Diagnosis not present

## 2021-09-20 DIAGNOSIS — F3131 Bipolar disorder, current episode depressed, mild: Secondary | ICD-10-CM | POA: Diagnosis not present

## 2021-09-20 DIAGNOSIS — F3174 Bipolar disorder, in full remission, most recent episode manic: Secondary | ICD-10-CM | POA: Diagnosis not present

## 2021-10-04 DIAGNOSIS — J209 Acute bronchitis, unspecified: Secondary | ICD-10-CM | POA: Diagnosis not present

## 2021-10-26 DIAGNOSIS — M2550 Pain in unspecified joint: Secondary | ICD-10-CM | POA: Diagnosis not present

## 2021-10-26 DIAGNOSIS — M25569 Pain in unspecified knee: Secondary | ICD-10-CM | POA: Diagnosis not present

## 2021-10-26 DIAGNOSIS — M549 Dorsalgia, unspecified: Secondary | ICD-10-CM | POA: Diagnosis not present

## 2021-10-26 DIAGNOSIS — M25551 Pain in right hip: Secondary | ICD-10-CM | POA: Diagnosis not present

## 2021-11-09 DIAGNOSIS — Z049 Encounter for examination and observation for unspecified reason: Secondary | ICD-10-CM | POA: Diagnosis not present

## 2021-11-09 DIAGNOSIS — Z79899 Other long term (current) drug therapy: Secondary | ICD-10-CM | POA: Diagnosis not present

## 2021-11-09 DIAGNOSIS — G43719 Chronic migraine without aura, intractable, without status migrainosus: Secondary | ICD-10-CM | POA: Diagnosis not present

## 2021-11-22 DIAGNOSIS — H0011 Chalazion right upper eyelid: Secondary | ICD-10-CM | POA: Diagnosis not present

## 2021-11-22 DIAGNOSIS — H5789 Other specified disorders of eye and adnexa: Secondary | ICD-10-CM | POA: Diagnosis not present

## 2021-11-23 DIAGNOSIS — E039 Hypothyroidism, unspecified: Secondary | ICD-10-CM | POA: Diagnosis not present

## 2021-11-25 DIAGNOSIS — F410 Panic disorder [episodic paroxysmal anxiety] without agoraphobia: Secondary | ICD-10-CM | POA: Diagnosis not present

## 2021-11-25 DIAGNOSIS — F5101 Primary insomnia: Secondary | ICD-10-CM | POA: Diagnosis not present

## 2021-11-25 DIAGNOSIS — F3132 Bipolar disorder, current episode depressed, moderate: Secondary | ICD-10-CM | POA: Diagnosis not present

## 2021-11-26 DIAGNOSIS — E049 Nontoxic goiter, unspecified: Secondary | ICD-10-CM | POA: Diagnosis not present

## 2021-11-26 DIAGNOSIS — Z79890 Hormone replacement therapy (postmenopausal): Secondary | ICD-10-CM | POA: Diagnosis not present

## 2021-11-26 DIAGNOSIS — E039 Hypothyroidism, unspecified: Secondary | ICD-10-CM | POA: Diagnosis not present

## 2021-11-28 DIAGNOSIS — H00014 Hordeolum externum left upper eyelid: Secondary | ICD-10-CM | POA: Diagnosis not present

## 2021-11-28 DIAGNOSIS — R051 Acute cough: Secondary | ICD-10-CM | POA: Diagnosis not present

## 2021-11-29 DIAGNOSIS — H00014 Hordeolum externum left upper eyelid: Secondary | ICD-10-CM | POA: Diagnosis not present

## 2021-12-16 DIAGNOSIS — G43719 Chronic migraine without aura, intractable, without status migrainosus: Secondary | ICD-10-CM | POA: Diagnosis not present

## 2021-12-16 DIAGNOSIS — G518 Other disorders of facial nerve: Secondary | ICD-10-CM | POA: Diagnosis not present

## 2021-12-16 DIAGNOSIS — M542 Cervicalgia: Secondary | ICD-10-CM | POA: Diagnosis not present

## 2021-12-16 DIAGNOSIS — M791 Myalgia, unspecified site: Secondary | ICD-10-CM | POA: Diagnosis not present

## 2022-01-03 DIAGNOSIS — G43719 Chronic migraine without aura, intractable, without status migrainosus: Secondary | ICD-10-CM | POA: Diagnosis not present

## 2022-01-03 DIAGNOSIS — M791 Myalgia, unspecified site: Secondary | ICD-10-CM | POA: Diagnosis not present

## 2022-01-03 DIAGNOSIS — M542 Cervicalgia: Secondary | ICD-10-CM | POA: Diagnosis not present

## 2022-01-03 DIAGNOSIS — G518 Other disorders of facial nerve: Secondary | ICD-10-CM | POA: Diagnosis not present

## 2022-01-12 DIAGNOSIS — M25551 Pain in right hip: Secondary | ICD-10-CM | POA: Diagnosis not present

## 2022-01-17 DIAGNOSIS — M7061 Trochanteric bursitis, right hip: Secondary | ICD-10-CM | POA: Diagnosis not present

## 2022-01-18 DIAGNOSIS — M542 Cervicalgia: Secondary | ICD-10-CM | POA: Diagnosis not present

## 2022-01-18 DIAGNOSIS — G43719 Chronic migraine without aura, intractable, without status migrainosus: Secondary | ICD-10-CM | POA: Diagnosis not present

## 2022-01-18 DIAGNOSIS — G518 Other disorders of facial nerve: Secondary | ICD-10-CM | POA: Diagnosis not present

## 2022-01-18 DIAGNOSIS — M791 Myalgia, unspecified site: Secondary | ICD-10-CM | POA: Diagnosis not present

## 2022-01-20 DIAGNOSIS — M7061 Trochanteric bursitis, right hip: Secondary | ICD-10-CM | POA: Diagnosis not present

## 2022-01-24 DIAGNOSIS — F3174 Bipolar disorder, in full remission, most recent episode manic: Secondary | ICD-10-CM | POA: Diagnosis not present

## 2022-01-24 DIAGNOSIS — F410 Panic disorder [episodic paroxysmal anxiety] without agoraphobia: Secondary | ICD-10-CM | POA: Diagnosis not present

## 2022-01-24 DIAGNOSIS — F3176 Bipolar disorder, in full remission, most recent episode depressed: Secondary | ICD-10-CM | POA: Diagnosis not present

## 2022-01-24 DIAGNOSIS — M7061 Trochanteric bursitis, right hip: Secondary | ICD-10-CM | POA: Diagnosis not present

## 2022-01-27 DIAGNOSIS — M7061 Trochanteric bursitis, right hip: Secondary | ICD-10-CM | POA: Diagnosis not present

## 2022-01-31 DIAGNOSIS — M7061 Trochanteric bursitis, right hip: Secondary | ICD-10-CM | POA: Diagnosis not present

## 2022-02-01 DIAGNOSIS — M791 Myalgia, unspecified site: Secondary | ICD-10-CM | POA: Diagnosis not present

## 2022-02-01 DIAGNOSIS — G43719 Chronic migraine without aura, intractable, without status migrainosus: Secondary | ICD-10-CM | POA: Diagnosis not present

## 2022-02-01 DIAGNOSIS — G518 Other disorders of facial nerve: Secondary | ICD-10-CM | POA: Diagnosis not present

## 2022-02-01 DIAGNOSIS — M542 Cervicalgia: Secondary | ICD-10-CM | POA: Diagnosis not present

## 2022-02-04 DIAGNOSIS — M7061 Trochanteric bursitis, right hip: Secondary | ICD-10-CM | POA: Diagnosis not present

## 2022-02-07 DIAGNOSIS — M7061 Trochanteric bursitis, right hip: Secondary | ICD-10-CM | POA: Diagnosis not present

## 2022-02-10 DIAGNOSIS — M25551 Pain in right hip: Secondary | ICD-10-CM | POA: Diagnosis not present

## 2022-02-11 DIAGNOSIS — M7061 Trochanteric bursitis, right hip: Secondary | ICD-10-CM | POA: Diagnosis not present

## 2022-02-14 DIAGNOSIS — G518 Other disorders of facial nerve: Secondary | ICD-10-CM | POA: Diagnosis not present

## 2022-02-14 DIAGNOSIS — M791 Myalgia, unspecified site: Secondary | ICD-10-CM | POA: Diagnosis not present

## 2022-02-14 DIAGNOSIS — M542 Cervicalgia: Secondary | ICD-10-CM | POA: Diagnosis not present

## 2022-02-14 DIAGNOSIS — G43719 Chronic migraine without aura, intractable, without status migrainosus: Secondary | ICD-10-CM | POA: Diagnosis not present

## 2022-02-17 DIAGNOSIS — M7061 Trochanteric bursitis, right hip: Secondary | ICD-10-CM | POA: Diagnosis not present

## 2022-02-28 ENCOUNTER — Ambulatory Visit: Payer: BC Managed Care – PPO | Admitting: Family Medicine

## 2022-03-02 DIAGNOSIS — M791 Myalgia, unspecified site: Secondary | ICD-10-CM | POA: Diagnosis not present

## 2022-03-02 DIAGNOSIS — M542 Cervicalgia: Secondary | ICD-10-CM | POA: Diagnosis not present

## 2022-03-02 DIAGNOSIS — G43719 Chronic migraine without aura, intractable, without status migrainosus: Secondary | ICD-10-CM | POA: Diagnosis not present

## 2022-03-02 DIAGNOSIS — G518 Other disorders of facial nerve: Secondary | ICD-10-CM | POA: Diagnosis not present

## 2022-03-03 ENCOUNTER — Ambulatory Visit: Payer: BC Managed Care – PPO | Admitting: Sports Medicine

## 2022-03-03 VITALS — BP 116/78 | Ht 69.5 in | Wt 178.0 lb

## 2022-03-03 DIAGNOSIS — M25551 Pain in right hip: Secondary | ICD-10-CM | POA: Diagnosis not present

## 2022-03-03 NOTE — Progress Notes (Signed)
PCP: Aura Dials, MD  Subjective:   HPI: Patient is a 51 y.o. female here for right lateral hip pain.  Patient reports right lateral hip pain for the past 3-4 years. Symptoms wax and wane in severity. The area is painful to touch. Pain is also aggravated by walking on an incline and with stairs. She states "going upstairs is a nightmare", to the point where she has brought her clothes, hairdryer etc downstairs so she doesn't have to go upstairs as much. She has had 2 steroid injections which she found fairly helpful. She has also done 2 rounds of physical therapy (remains in PT currently). Found PT helpful at first but now they "overdo it" and she leaves feeling worse than before.  Has been seeing Dr Sheppard Coil who diagnosed her with greater trochanteric pain syndrome. She has not had any imaging of the area.   Past Medical History:  Diagnosis Date   Bipolar 1 disorder ([place])    (Dr. Toy Care)- Started after second pregnancy   Hypothyroidism    Lumbar degenerative disc disease    Migraine    headaches (Dr. Catalina Gravel)   Thyroid disease     Current Outpatient Medications on File Prior to Visit  Medication Sig Dispense Refill   Atogepant (QULIPTA) 60 MG TABS Take 60 mg by mouth at bedtime.     clonazePAM (KLONOPIN) 1 MG tablet Take 1 mg by mouth 3 (three) times daily.     lamoTRIgine (LAMICTAL) 200 MG tablet Take 400 mg by mouth every evening.      levothyroxine (SYNTHROID) 150 MCG tablet 1 tablet     levothyroxine (SYNTHROID, LEVOTHROID) 25 MCG tablet Take 25 mcg by mouth daily before breakfast.     AJOVY 225 MG/1.5ML SOSY Inject into the skin.     ALPRAZolam (XANAX) 0.5 MG tablet Take 0.5 mg by mouth at bedtime as needed for anxiety.     ALPRAZolam (XANAX) 1 MG tablet 1 tablet     Atogepant (QULIPTA) 60 MG TABS Qulipta 60 mg tablet  take one daily to prevent migraine and uberlvy as needed     brompheniramine-pseudoephedrine-DM 30-2-10 MG/5ML syrup brompheniramine-pseudoephedrine-DM 2  mg-30 mg-10 mg/5 mL oral syrup     butalbital-acetaminophen-caffeine (FIORICET, ESGIC) 50-325-40 MG per tablet Take 1 tablet by mouth daily as needed for headache.     CAPLYTA 21 MG CAPS Take 1 capsule by mouth at bedtime.     diclofenac Sodium (VOLTAREN) 1 % GEL diclofenac 1 % topical gel     estradiol (ESTRACE VAGINAL) 0.1 MG/GM vaginal cream Estrace 0.01% (0.1 mg/gram) vaginal cream  INSERT PEA SIZED AMOUNT INTO VAGINA TWICE/WEEK     fluticasone (FLONASE) 50 MCG/ACT nasal spray 1 spray in each nostril     influenza vac split quadrivalent PF (FLUZONE QUADRIVALENT) 0.5 ML injection Fluzone Quad 2020-2021 (PF) 60 mcg (15 mcg x 4)/0.5 mL IM syringe  TO BE ADMINISTERED BY PHARMACIST FOR IMMUNIZATION     isometheptene-acetaminophen-dichloralphenazone (MIDRIN) 65-325-100 MG capsule Take 1 capsule by mouth 4 (four) times daily as needed for migraine. Maximum 5 capsules in 12 hours for migraine headaches, 8 capsules in 24 hours for tension headaches.     levothyroxine (SYNTHROID) 175 MCG tablet Take 175 mcg by mouth daily before breakfast.     memantine (NAMENDA) 10 MG tablet Take 10 mg by mouth 2 (two) times daily.     Multiple Vitamin (MULTI-VITAMIN) tablet Take 1 tablet once a day. Multivitamin plus D     omeprazole (PRILOSEC OTC) 20  MG tablet 1 tablet 30 minutes before morning meal     pantoprazole (PROTONIX) 40 MG tablet pantoprazole 40 mg tablet,delayed release     promethazine (PHENERGAN) 25 MG tablet Take 25 mg by mouth every 4 (four) hours as needed for nausea or vomiting.     QUEtiapine (SEROQUEL) 100 MG tablet 1 tablet     QUEtiapine (SEROQUEL) 25 MG tablet Take 75 mg by mouth at bedtime.     Rimegepant Sulfate (NURTEC) 75 MG TBDP Nurtec ODT 75 mg disintegrating tablet  DSSOLVE ONE ON TONGUE DAILY AS NEEDED FOR MIGRAINE. MAY USE WITH BUPAP.     tizanidine (ZANAFLEX) 2 MG capsule Take by mouth.     Turmeric Curcumin 500 MG CAPS      UBRELVY 100 MG TABS Take by mouth.     Zonisamide  (ZONEGRAN PO) Take 400 mg by mouth daily.      zonisamide (ZONEGRAN) 25 MG capsule Take by mouth.     Zoster Vaccine Adjuvanted (SHINGRIX) injection Shingrix (PF) 50 mcg/0.5 mL intramuscular suspension, kit  TO BE ADMINISTERED BY PHARMACIST FOR IMMUNIZATION     No current facility-administered medications on file prior to visit.    Past Surgical History:  Procedure Laterality Date   ABDOMINAL HYSTERECTOMY     CESAREAN SECTION  1998   CYSTECTOMY     Uterine   US ECHOCARDIOGRAPHY     Normal EF, mild MR/TR, Normal LV size and function. There were no regional wall motion abnormailities. Left Ventricular ejection fraction estimated by 2D at 60-65%. Mild mitral valve regurgitation. Mild tricuspid regurgitation    Allergies  Allergen Reactions   Codeine Nausea And Vomiting   Depakote [Divalproex Sodium] Swelling   Morphine And Related Nausea And Vomiting   Sulfa Antibiotics     Stomach upset: Side Effects    BP 116/78   Ht 5' 9.5" (1.765 m)   Wt 178 lb (80.7 kg)   BMI 25.91 kg/m       Objective:  Physical Exam:  Gen: NAD, comfortable in exam room  MSK: inspection of the right hip is normal without deformity or skin changes. There is tenderness to palpation at inferior aspect of greater trochanter on right. FROM of the hip. 5/5 strength with hip abduction, flexion, and extension. Negative Trendelenburg's. Negative FABER. Mild IT band tightness with Ober's. NVI distally.   Assessment & Plan:  1. Right Sided Greater Trochanteric Pain Syndrome- ongoing for several years with no resolution despite PT and corticosteroid injections. She is interested in shock-wave therapy and first session was performed today. If no improvement with future sessions can consider advanced imaging such as MRI.   Alcus Dad, MD PGY-2, [facility]  Patient seen and evaluated with the resident.  I agree with the above plan of care.  First soundwave treatment performed as below.  We  will go ahead and schedule her second treatment for next week.  I discussed initially starting with 4 weekly treatments and then reevaluating her progress.  Procedure: ECSWT Indications: Greater trochanteric pain syndrome, right hip   Procedure Details Consent: Risks of procedure as well as the alternatives and risks of each were explained to the patient.  Written consent for procedure obtained. Time Out: Verified patient identification, verified procedure, site was marked, verified correct patient position, medications/allergies/relevent history reviewed.  The area was cleaned with alcohol swab.     The right greater trochanteric area was targeted for Extracorporeal shockwave therapy.    Preset: Trochanteric bursitis Power  Level: 90 Frequency: 10 Impulse/cycles: 2000 Head size: Large   Patient tolerated procedure well without immediate complications

## 2022-03-10 ENCOUNTER — Ambulatory Visit (INDEPENDENT_AMBULATORY_CARE_PROVIDER_SITE_OTHER): Payer: Self-pay | Admitting: Sports Medicine

## 2022-03-10 DIAGNOSIS — M25551 Pain in right hip: Secondary | ICD-10-CM

## 2022-03-10 NOTE — Progress Notes (Signed)
Patient ID: Patricia Adkins, female   DOB: [birth year], 51 y.o.   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  She did not notice much benefit after the first treatment but she would like to go ahead and proceed with the second treatment today.  Treatment performed as below and she will follow-up next week for her third of four planned treatments.  Procedure: ECSWT Indications: Greater trochanteric pain syndrome, right hip   Procedure Details Consent: Risks of procedure as well as the alternatives and risks of each were explained to the patient.  Written consent for procedure obtained. Time Out: Verified patient identification, verified procedure, site was marked, verified correct patient position, medications/allergies/relevent history reviewed.  The area was cleaned with alcohol swab.     The right greater trochanteric area was targeted for Extracorporeal shockwave therapy.    Preset: Trochanteric bursitis Power Level: 100 Frequency: 12 Impulse/cycles: 2000 Head size: Large   Patient tolerated procedure well without immediate complications

## 2022-03-15 ENCOUNTER — Ambulatory Visit (INDEPENDENT_AMBULATORY_CARE_PROVIDER_SITE_OTHER): Payer: Self-pay | Admitting: Sports Medicine

## 2022-03-15 VITALS — Ht 69.5 in

## 2022-03-15 DIAGNOSIS — M25551 Pain in right hip: Secondary | ICD-10-CM

## 2022-03-15 NOTE — Progress Notes (Signed)
Patient ID: AMILA CALLIES, female   DOB: [birth year], 51 y.o.   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  She feels like the first 2 treatments may have been helpful.  She has stopped doing her home exercises that cause her pain.  Third treatment performed as below.  She will follow-up next week for her fourth treatment.  We will decide at that time whether or not to continue.  Procedure: ECSWT Indications: Greater trochanteric pain syndrome, right hip   Procedure Details Consent: Risks of procedure as well as the alternatives and risks of each were explained to the patient.  Written consent for procedure obtained. Time Out: Verified patient identification, verified procedure, site was marked, verified correct patient position, medications/allergies/relevent history reviewed.  The area was cleaned with alcohol swab.     The greater trochanteric area was targeted for Extracorporeal shockwave therapy.    Preset: Greater trochanteric bursitis Power Level: 120 Frequency: 12 Impulse/cycles: 2000 Head size: Large   Patient tolerated procedure well without immediate complications

## 2022-03-17 ENCOUNTER — Ambulatory Visit: Payer: BC Managed Care – PPO | Admitting: Sports Medicine

## 2022-03-24 ENCOUNTER — Ambulatory Visit (INDEPENDENT_AMBULATORY_CARE_PROVIDER_SITE_OTHER): Payer: Self-pay | Admitting: Sports Medicine

## 2022-03-24 DIAGNOSIS — M25551 Pain in right hip: Secondary | ICD-10-CM

## 2022-03-24 NOTE — Progress Notes (Signed)
Patient ID: Patricia Adkins, female   DOB: 05-11-1971, 51 y.o.   MRN: [id number]  Patricia Adkins presents today for her fourth soundwave treatment.  She is cautiously optimistic that it may be helping.  She was ill last week and was not able to do her home exercises.  She would like to go ahead with today's fourth treatment and schedule a tentative fifth treatment for next week.  We once again discussed the possibility of MRI if her symptoms do not continue to improve.  She can discuss that further with Dr. Sheppard Coil.  Procedure: ECSWT Indications: Greater trochanteric pain syndrome, right hip   Procedure Details Consent: Risks of procedure as well as the alternatives and risks of each were explained to the patient.  Written consent for procedure obtained. Time Out: Verified patient identification, verified procedure, site was marked, verified correct patient position, medications/allergies/relevent history reviewed.  The area was cleaned with alcohol swab.     The greater trochanteric area was targeted for Extracorporeal shockwave therapy.    Preset: Greater trochanteric bursitis Power Level: 120 Frequency: 12 Impulse/cycles: 2000 Head size: Large   Patient tolerated procedure well without immediate complications

## 2022-03-25 ENCOUNTER — Other Ambulatory Visit: Payer: Self-pay | Admitting: Sports Medicine

## 2022-03-25 DIAGNOSIS — M25551 Pain in right hip: Secondary | ICD-10-CM

## 2022-03-28 DIAGNOSIS — Z6826 Body mass index (BMI) 26.0-26.9, adult: Secondary | ICD-10-CM | POA: Diagnosis not present

## 2022-03-28 DIAGNOSIS — M629 Disorder of muscle, unspecified: Secondary | ICD-10-CM | POA: Diagnosis not present

## 2022-03-28 DIAGNOSIS — Z1272 Encounter for screening for malignant neoplasm of vagina: Secondary | ICD-10-CM | POA: Diagnosis not present

## 2022-03-28 DIAGNOSIS — Z13820 Encounter for screening for osteoporosis: Secondary | ICD-10-CM | POA: Diagnosis not present

## 2022-03-28 DIAGNOSIS — Z1231 Encounter for screening mammogram for malignant neoplasm of breast: Secondary | ICD-10-CM | POA: Diagnosis not present

## 2022-03-28 DIAGNOSIS — N952 Postmenopausal atrophic vaginitis: Secondary | ICD-10-CM | POA: Diagnosis not present

## 2022-03-28 DIAGNOSIS — Z01419 Encounter for gynecological examination (general) (routine) without abnormal findings: Secondary | ICD-10-CM | POA: Diagnosis not present

## 2022-03-29 DIAGNOSIS — R140 Abdominal distension (gaseous): Secondary | ICD-10-CM | POA: Diagnosis not present

## 2022-03-29 DIAGNOSIS — R109 Unspecified abdominal pain: Secondary | ICD-10-CM | POA: Diagnosis not present

## 2022-03-31 ENCOUNTER — Ambulatory Visit (INDEPENDENT_AMBULATORY_CARE_PROVIDER_SITE_OTHER): Payer: Self-pay | Admitting: Sports Medicine

## 2022-03-31 DIAGNOSIS — M25551 Pain in right hip: Secondary | ICD-10-CM

## 2022-03-31 NOTE — Progress Notes (Signed)
Patient ID: ASERET HOFFMAN, female   DOB: 12/13/1970, 51 y.o.   MRN: [id number]  Akisha presents today for her fifth soundwave treatment for right hip greater trochanteric pain syndrome.  She states that she noticed the most benefit after last week's treatment.  Overall, she is about 40% better.  She does have an MRI scheduled a week from today.  Fifth treatment was performed as below.  She will follow-up with me again in 2 weeks for her sixth treatment.  We can discuss MRI findings at that visit as well.  Procedure: ECSWT Indications: Greater trochanteric pain syndrome, right hip   Procedure Details Consent: Risks of procedure as well as the alternatives and risks of each were explained to the patient.  Written consent for procedure obtained. Time Out: Verified patient identification, verified procedure, site was marked, verified correct patient position, medications/allergies/relevent history reviewed.  The area was cleaned with alcohol swab.     The greater trochanteric area was targeted for Extracorporeal shockwave therapy.    Preset: Greater trochanteric bursitis Power Level: 120 Frequency: 12 Impulse/cycles: 2500 Head size: Large   Patient tolerated procedure well without immediate complications   This note was dictated using Dragon naturally speaking software and may contain errors in syntax, spelling, or content which have not been identified prior to signing this note.

## 2022-04-05 DIAGNOSIS — G43719 Chronic migraine without aura, intractable, without status migrainosus: Secondary | ICD-10-CM | POA: Diagnosis not present

## 2022-04-07 ENCOUNTER — Ambulatory Visit
Admission: RE | Admit: 2022-04-07 | Discharge: 2022-04-07 | Disposition: A | Discharge status: Home / Self Care | Payer: BC Managed Care – PPO | Source: Ambulatory Visit | Attending: Sports Medicine | Admitting: Sports Medicine

## 2022-04-07 DIAGNOSIS — M25551 Pain in right hip: Secondary | ICD-10-CM | POA: Diagnosis not present

## 2022-04-13 ENCOUNTER — Ambulatory Visit: Payer: BC Managed Care – PPO | Admitting: Dermatology

## 2022-04-13 DIAGNOSIS — R110 Nausea: Secondary | ICD-10-CM | POA: Diagnosis not present

## 2022-04-13 DIAGNOSIS — G43009 Migraine without aura, not intractable, without status migrainosus: Secondary | ICD-10-CM | POA: Diagnosis not present

## 2022-04-17 IMAGING — US US ABDOMEN COMPLETE
1 series · 14 of 25 positions shown · non-contrast
Comparison: None.

CLINICAL DATA: Upper abdominal pain

EXAM:
ABDOMEN ULTRASOUND COMPLETE

[Series 1: us abdomen complete · 0.17mm/px · 14 of 86 slices shown]
[im 1/86]
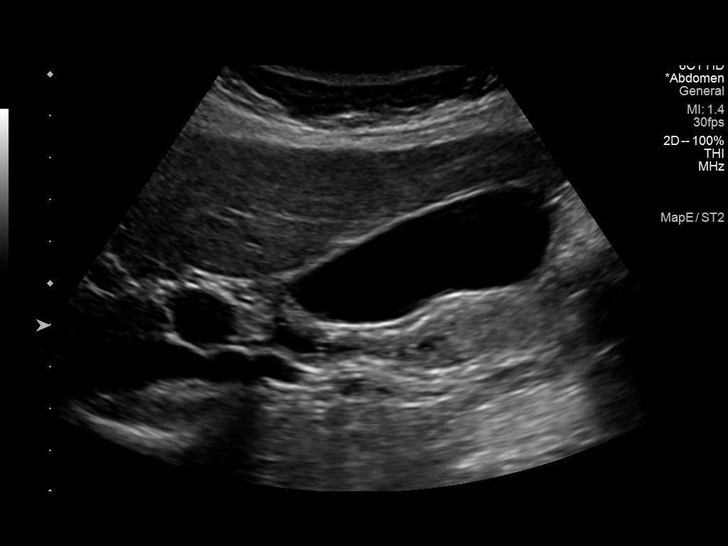
[im 8/86]
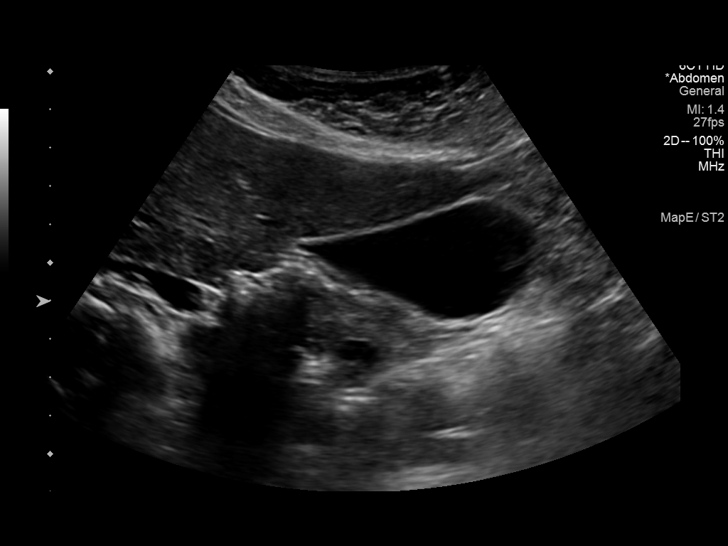
[im 15/86]
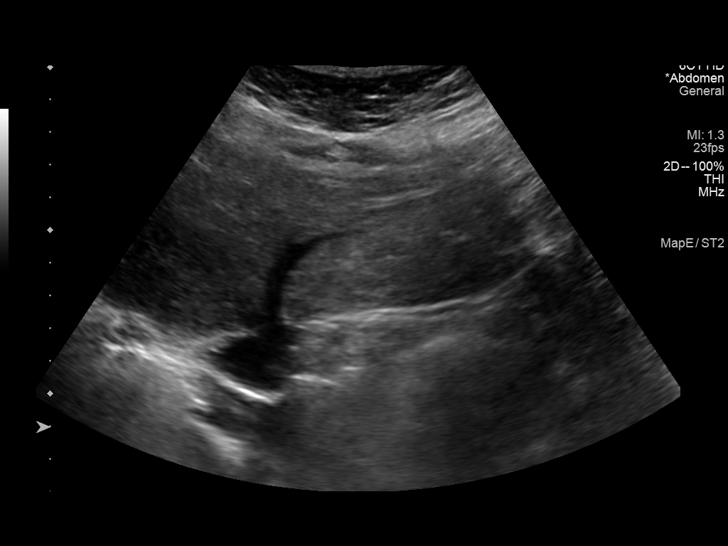
[im 22/86]
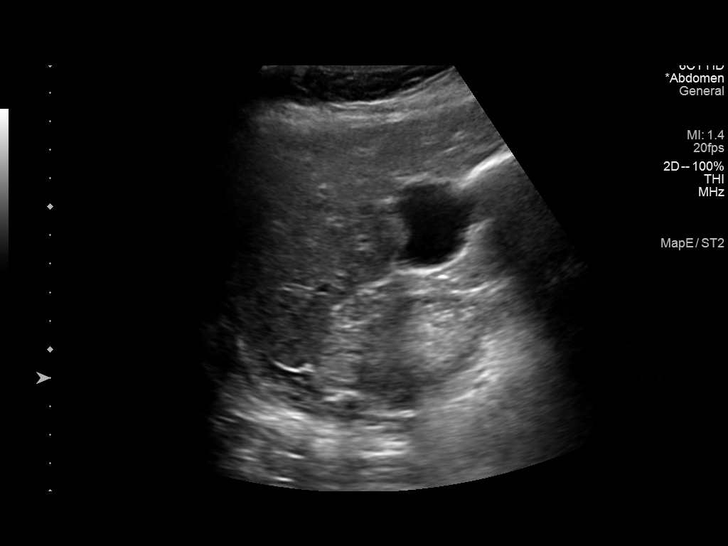
[im 29/86]
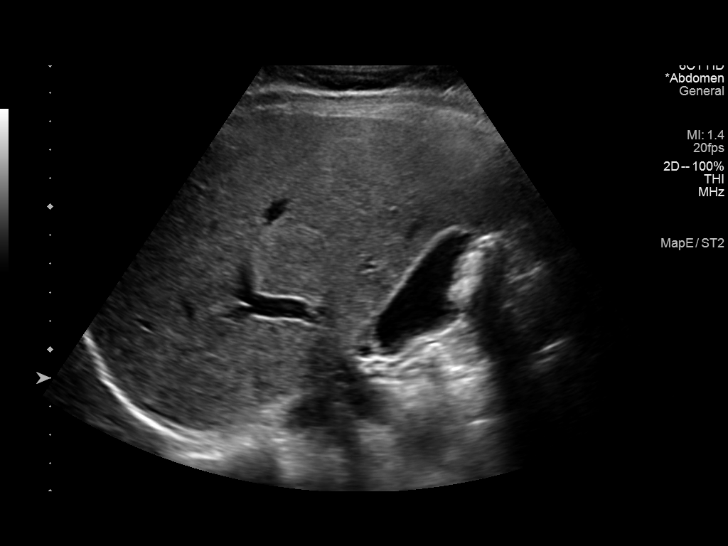
[im 32/86]
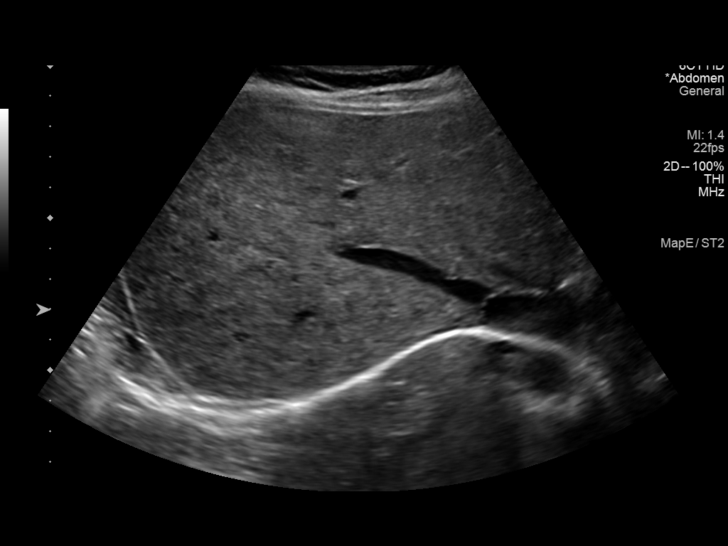
[im 39/86]
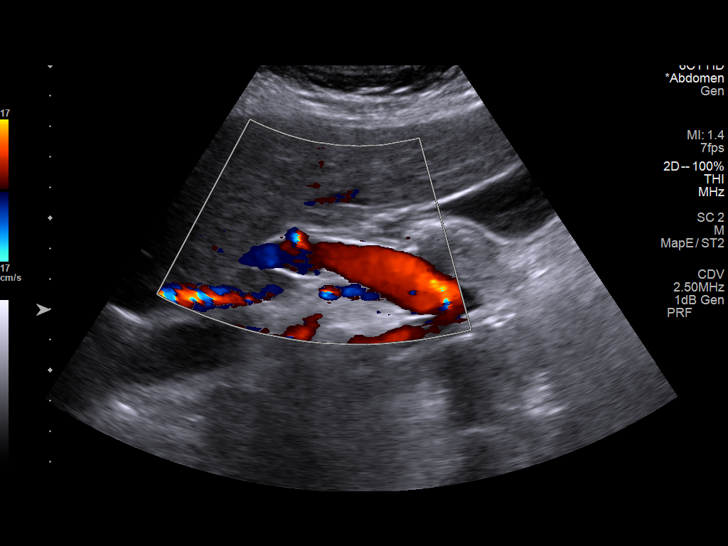
[im 47/86]
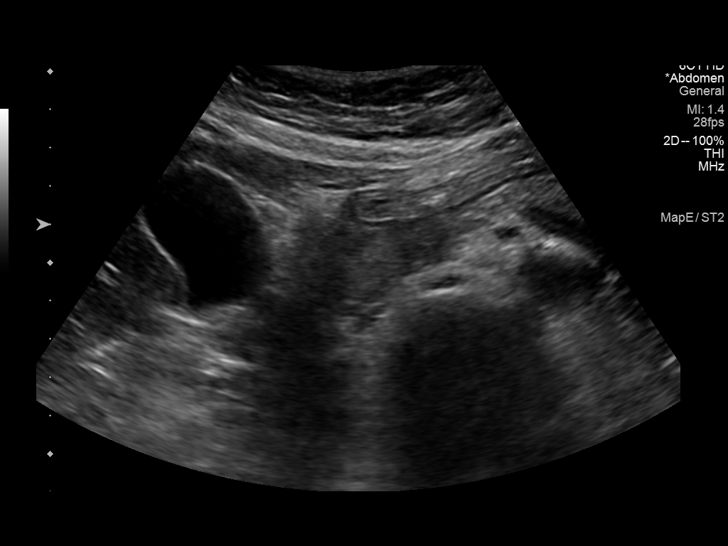
[im 54/86]
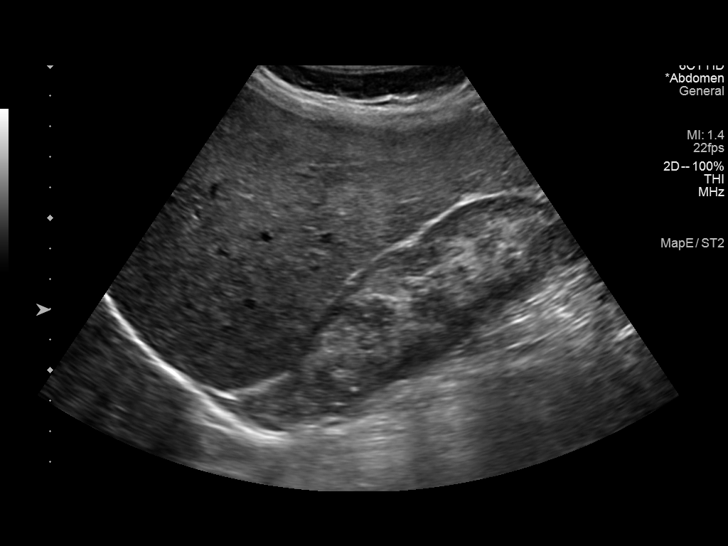
[im 57/86]
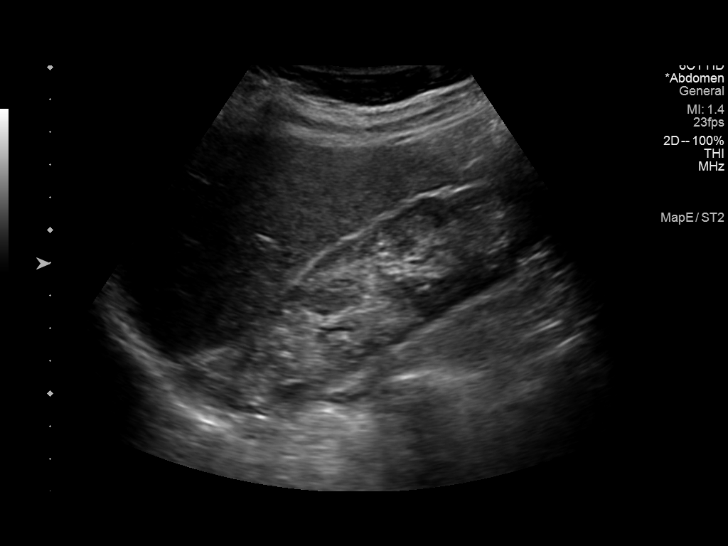
[im 64/86]
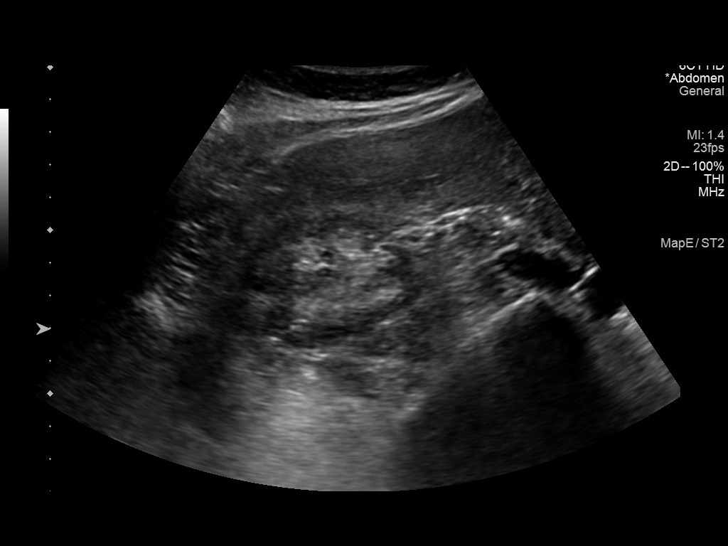
[im 71/86]
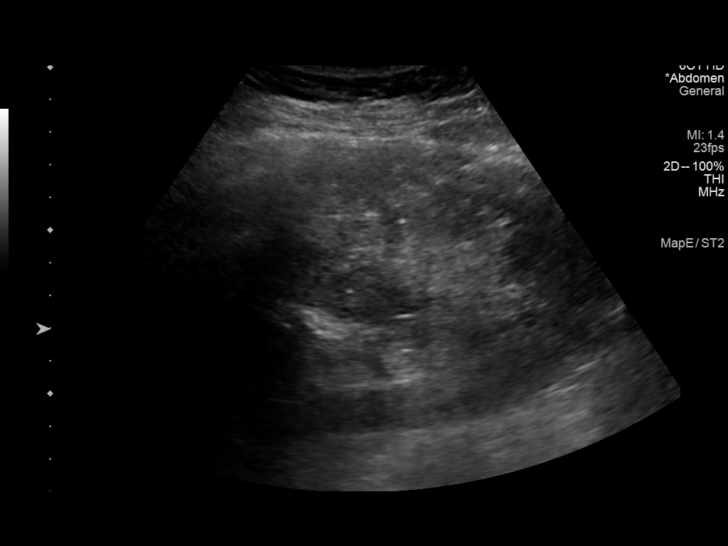
[im 78/86]
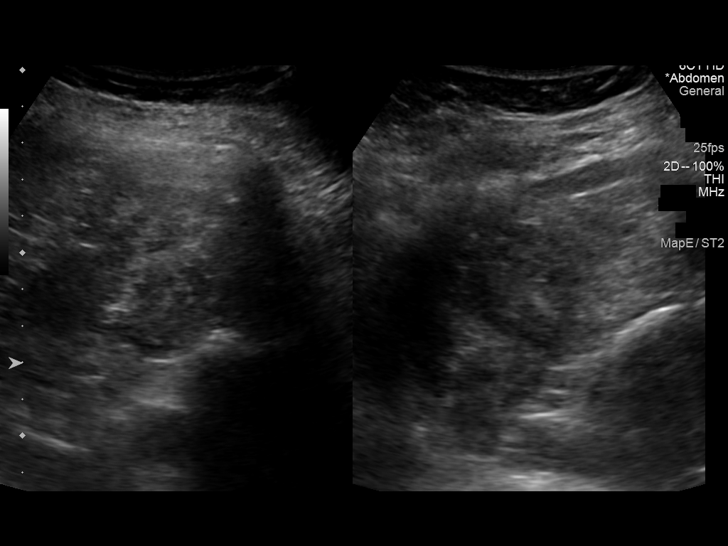
[im 86/86]
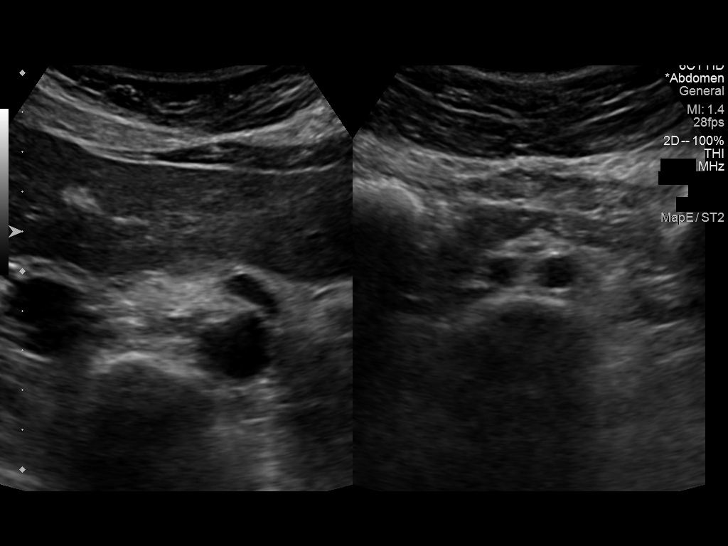

[14 of 25 positions shown; findings below may reference images not displayed]

FINDINGS: Gallbladder: No gallstones or wall thickening visualized. No
sonographic Murphy sign noted by sonographer.

Common bile duct: Diameter: 2 mm

Liver: No focal lesion identified. Within normal limits in
parenchymal echogenicity. Portal vein is patent on color Doppler
imaging with normal direction of blood flow towards the liver.

IVC: No abnormality visualized.

Pancreas: Visualized portion unremarkable.

Spleen: Size and appearance within normal limits.

Right Kidney: Length: 11.8 cm. Renal cortical thinning. Echogenicity
within normal limits. No mass or hydronephrosis visualized.

Left Kidney: Length: 11.6 cm. Renal cortical thinning. Echogenicity
within normal limits. No mass or hydronephrosis visualized.

Abdominal aorta: No aneurysm visualized.

Other findings: None.
IMPRESSION: 1.  No cholelithiasis.

2. Bilateral renal cortical thinning as can be seen with medical
renal disease.

## 2022-04-18 DIAGNOSIS — M25551 Pain in right hip: Secondary | ICD-10-CM | POA: Diagnosis not present

## 2022-04-18 DIAGNOSIS — M24151 Other articular cartilage disorders, right hip: Secondary | ICD-10-CM | POA: Diagnosis not present

## 2022-04-23 IMAGING — US US RENAL
1 series · 14 of 25 positions shown · non-contrast
Comparison: Abdominal ultrasound 07/09/2020

CLINICAL DATA: Abnormal kidney function

EXAM:
RENAL / URINARY TRACT ULTRASOUND COMPLETE

[Series 1: us renal · 0.24mm/px · 14 of 27 slices shown]
[im 1/27]
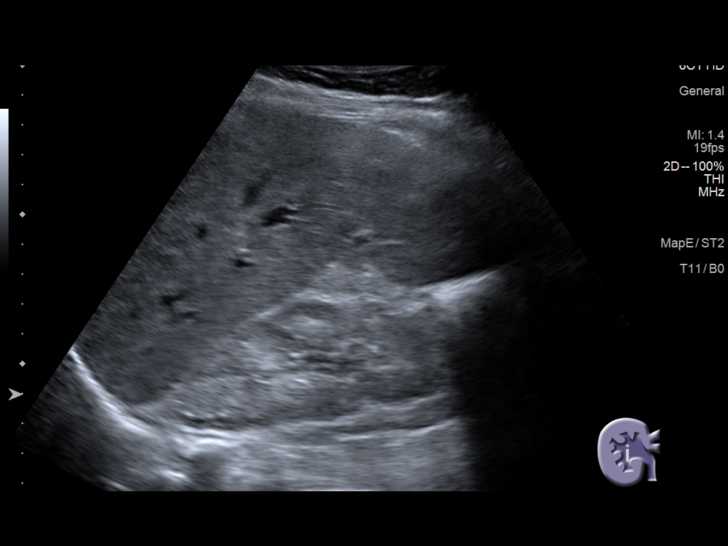
[im 3/27]
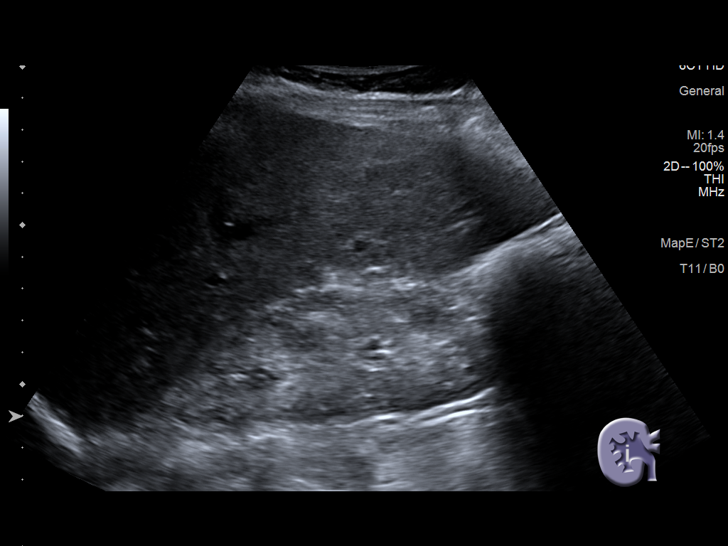
[im 5/27]
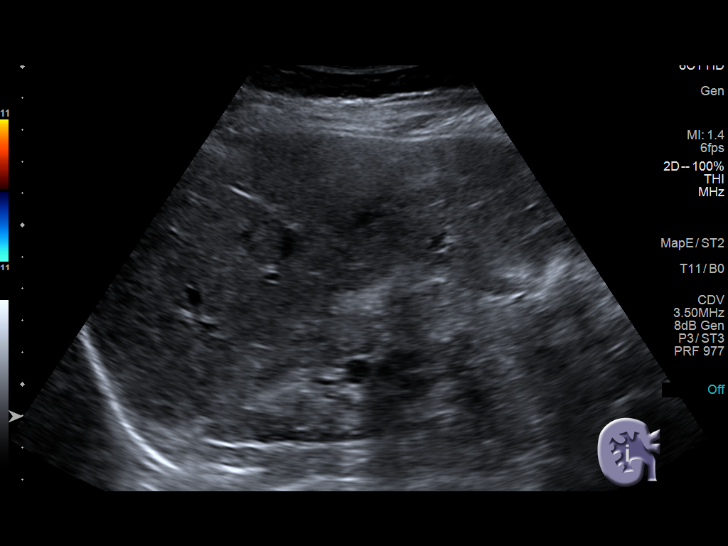
[im 7/27]
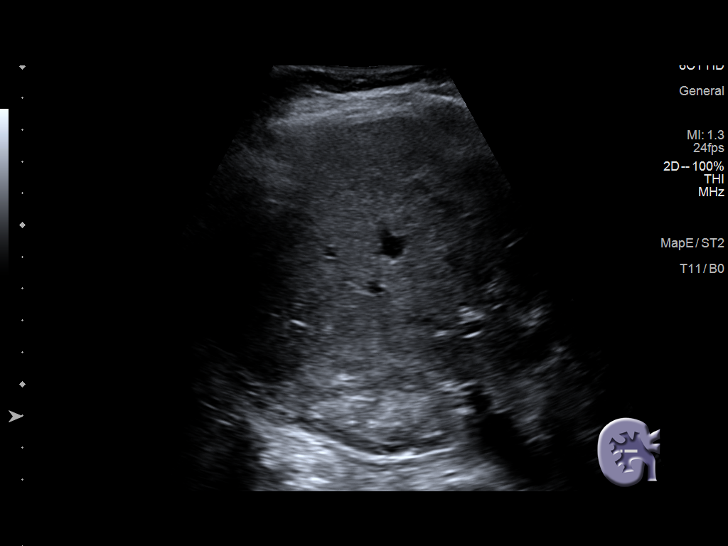
[im 9/27]
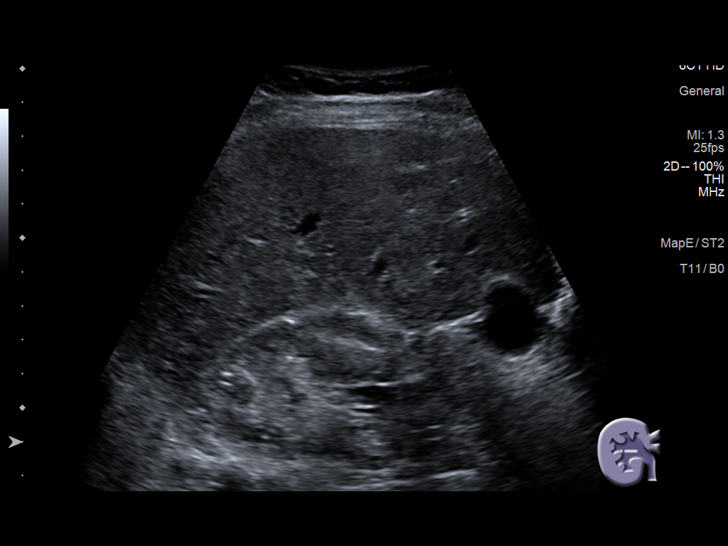
[im 10/27]
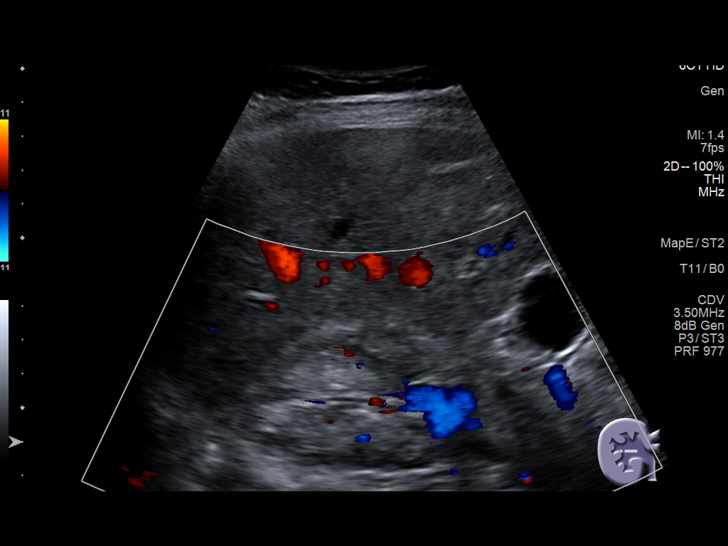
[im 12/27]
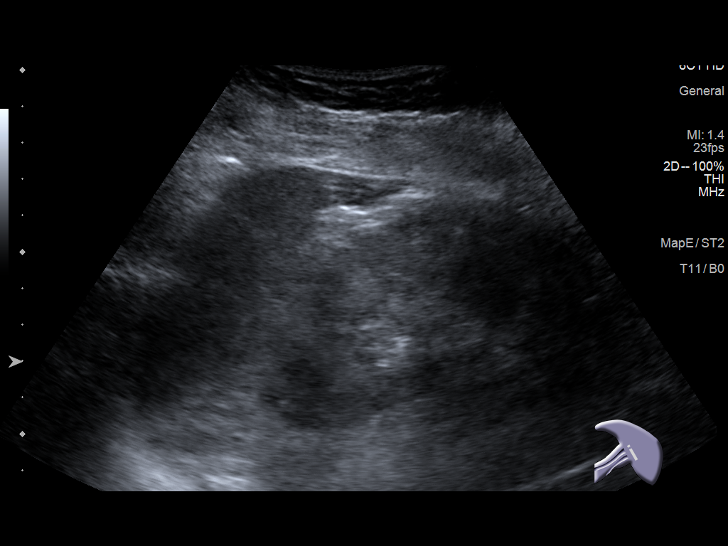
[im 15/27]
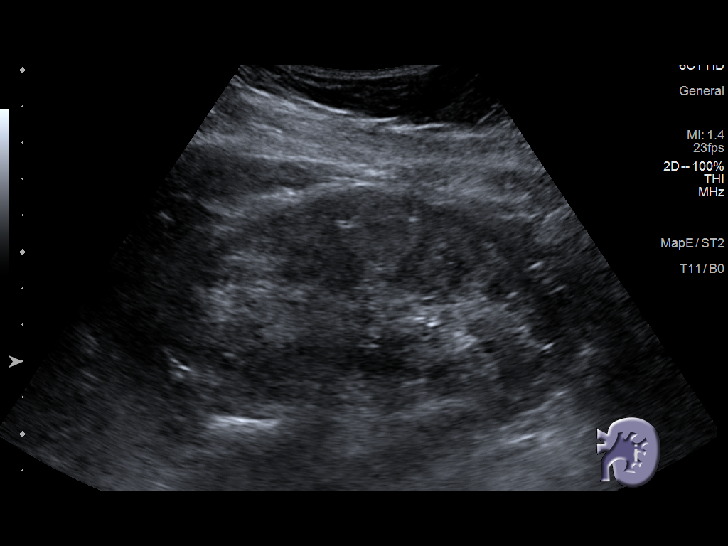
[im 17/27]
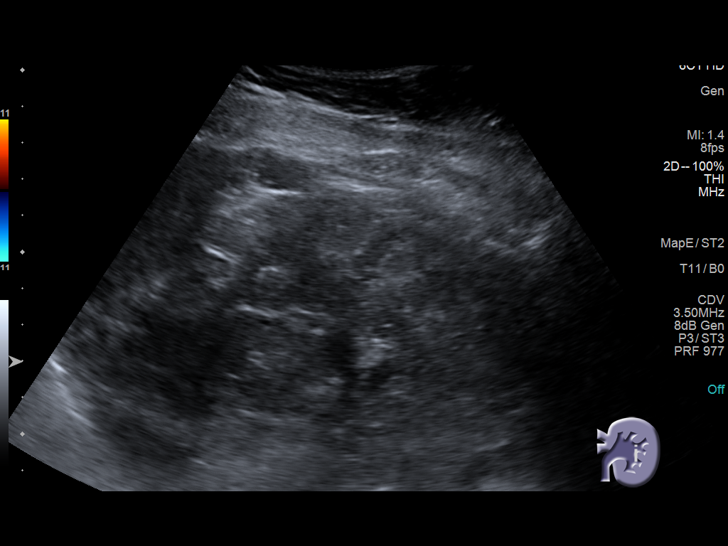
[im 18/27]
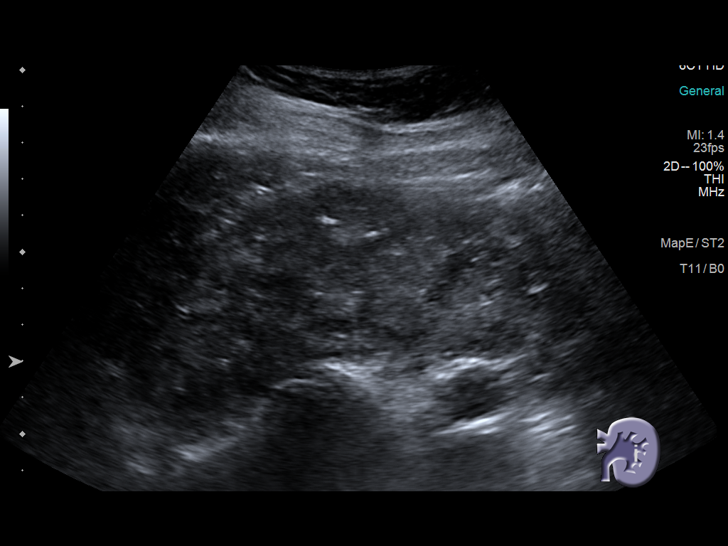
[im 20/27]
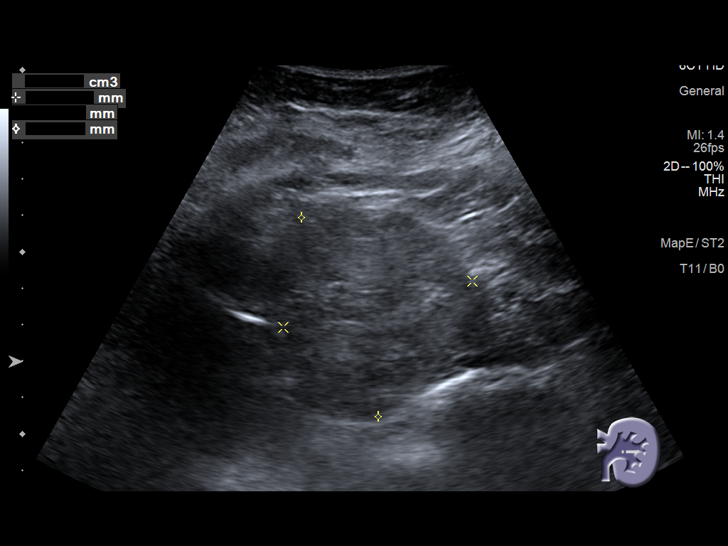
[im 22/27]
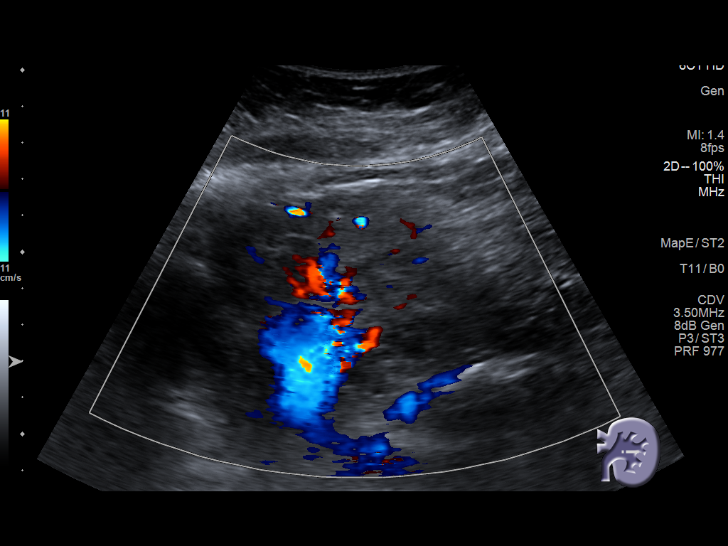
[im 24/27]
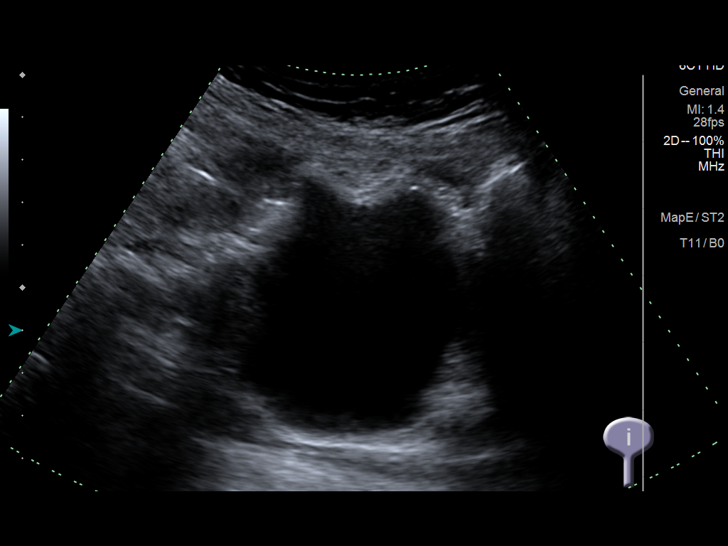
[im 27/27]
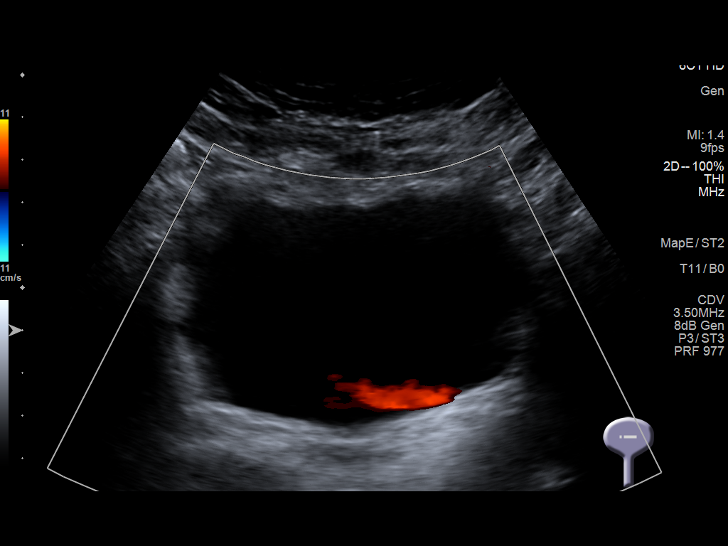

[14 of 25 positions shown; findings below may reference images not displayed]

FINDINGS: Right Kidney:

Renal measurements: 10.5 x 5.7 x 4.6 cm = volume: 143 mL. Cortical
thinning. Increased cortical echogenicity. No mass or
hydronephrosis. No shadowing calculi.

Left Kidney:

Renal measurements: 11.6 x 5.4 x 5.9 cm = volume: 190 mL. Cortical
thinning. Increased cortical echogenicity. No mass or
hydronephrosis. No shadowing calculi.

Bladder:

Appears normal for degree of bladder distention. BILATERAL ureteral
jets visualized.

Other:

N/A
IMPRESSION: Cortical thinning and medical renal disease changes of both kidneys.

No evidence of renal mass or hydronephrosis.

## 2022-04-27 ENCOUNTER — Ambulatory Visit: Payer: BC Managed Care – PPO | Admitting: Dermatology

## 2022-04-27 ENCOUNTER — Encounter: Payer: Self-pay | Admitting: Dermatology

## 2022-04-27 DIAGNOSIS — Z1283 Encounter for screening for malignant neoplasm of skin: Secondary | ICD-10-CM

## 2022-04-27 DIAGNOSIS — L700 Acne vulgaris: Secondary | ICD-10-CM | POA: Diagnosis not present

## 2022-04-27 DIAGNOSIS — Z86018 Personal history of other benign neoplasm: Secondary | ICD-10-CM | POA: Diagnosis not present

## 2022-04-27 MED ORDER — METRONIDAZOLE 0.75 % EX LOTN: 1.0000 | TOPICAL_LOTION | Freq: Every evening | CUTANEOUS | 11 refills | Status: AC

## 2022-05-18 ENCOUNTER — Encounter: Payer: Self-pay | Admitting: Dermatology

## 2022-05-18 NOTE — Progress Notes (Signed)
   Follow-Up Visit   Subjective  Patricia Adkins is a 51 y.o. female who presents for the following: Annual Exam (Patient here today for yearly skin check, no concerns. Personal history of atypical moles. No family history of atypical moles, melanoma or non mole skin cancer. ).  General skin check, face breaking out Location:  Duration:  Quality:  Associated Signs/Symptoms: Modifying Factors:  Severity:  Timing: Context:   Objective  Well appearing patient in no apparent distress; mood and affect are within normal limits. Head to Toe History of multiple atypical moles.  No new or recurrent atypical nevi (all pigmented lesions checked with dermoscopy) or signs of NMSC noted at the time of the visit.   Left Buccal Cheek, Right Buccal Cheek Inflammatory papules which seems to be an intergrade between adult female acne and rosacea.    A full examination was performed including scalp, head, eyes, ears, nose, lips, neck, chest, axillae, abdomen, back, buttocks, bilateral upper extremities, bilateral lower extremities, hands, feet, fingers, toes, fingernails, and toenails. All findings within normal limits unless otherwise noted below.  Areas beneath undergarments not fully examined.   Assessment & Plan    Skin exam for malignant neoplasm Head to Toe  Yearly skin check.  Encouraged to self examine with spouse twice annually.  Continue ultraviolet protection.  Acne vulgaris Left Buccal Cheek; Right Buccal Cheek  Will try topical metronidazole nightly for 6 weeks, follow-up by MyChart at that time.  METRONIDAZOLE, TOPICAL, 0.75 % LOTN - Left Buccal Cheek, Right Buccal Cheek Apply 1 Application topically at bedtime.      I, Lavonna Monarch, MD, have reviewed all documentation for this visit.  The documentation on 05/18/22 for the exam, diagnosis, procedures, and orders are all accurate and complete.

## 2022-05-20 ENCOUNTER — Encounter: Payer: Self-pay | Admitting: Dermatology

## 2022-05-24 DIAGNOSIS — G3184 Mild cognitive impairment, so stated: Secondary | ICD-10-CM | POA: Diagnosis not present

## 2022-05-24 DIAGNOSIS — F319 Bipolar disorder, unspecified: Secondary | ICD-10-CM | POA: Diagnosis not present

## 2022-05-24 DIAGNOSIS — E039 Hypothyroidism, unspecified: Secondary | ICD-10-CM | POA: Diagnosis not present

## 2022-05-24 DIAGNOSIS — K5289 Other specified noninfective gastroenteritis and colitis: Secondary | ICD-10-CM | POA: Diagnosis not present

## 2022-05-30 DIAGNOSIS — K219 Gastro-esophageal reflux disease without esophagitis: Secondary | ICD-10-CM | POA: Diagnosis not present

## 2022-05-30 DIAGNOSIS — R140 Abdominal distension (gaseous): Secondary | ICD-10-CM | POA: Diagnosis not present

## 2022-06-07 NOTE — Progress Notes (Signed)
COVID Vaccine Completed:  Date of COVID positive in last 90 days:  PCP - Aura Dials, MD Cardiologist -   Chest x-ray -  EKG -  Stress Test -  ECHO -  Cardiac Cath -  Pacemaker/ICD device last checked: Spinal Cord Stimulator:  Bowel Prep -   Sleep Study -  CPAP -   Fasting Blood Sugar -  Checks Blood Sugar _____ times a day  Blood Thinner Instructions: Aspirin Instructions: Last Dose:  Activity level:  Can go up a flight of stairs and perform activities of daily living without stopping and without symptoms of chest pain or shortness of breath.  Able to exercise without symptoms  Unable to go up a flight of stairs without symptoms of     Anesthesia review:   Patient denies shortness of breath, fever, cough and chest pain at PAT appointment  Patient verbalized understanding of instructions that were given to them at the PAT appointment. Patient was also instructed that they will need to review over the PAT instructions again at home before surgery.

## 2022-06-07 NOTE — Patient Instructions (Signed)
SURGICAL WAITING ROOM VISITATION Patients having surgery or a procedure may have no more than 2 support people in the waiting area - these visitors may rotate.   Children under the age of 36 must have an adult with them who is not the patient. If the patient needs to stay at the hospital during part of their recovery, the visitor guidelines for inpatient rooms apply. Pre-op nurse will coordinate an appropriate time for 1 support person to accompany patient in pre-op.  This support person may not rotate.    Please refer to the [facility] website for the visitor guidelines for Inpatients (after your surgery is over and you are in a regular room).    Your procedure is scheduled on: 06/17/22   Report to [facility] Main Entrance    Report to admitting at 8:00 AM   Call this number if you have problems the morning of surgery [phone]   Do not eat food :After Midnight.   After Midnight you may have the following liquids until 7:15 AM DAY OF SURGERY  Water Non-Citrus Juices (without pulp, NO RED) Carbonated Beverages Black Coffee (NO MILK/CREAM OR CREAMERS, sugar ok)  Clear Tea (NO MILK/CREAM OR CREAMERS, sugar ok) regular and decaf                             Plain Jell-O (NO RED)                                           Fruit ices (not with fruit pulp, NO RED)                                     Popsicles (NO RED)                                                               Sports drinks like Gatorade (NO RED)                 The day of surgery:  Drink ONE (1) Pre-Surgery Clear Ensure at 7:15 AM the morning of surgery. Drink in one sitting. Do not sip.  This drink was given to you during your hospital  pre-op appointment visit. Nothing else to drink after completing the  Pre-Surgery Clear Ensure.          If you have questions, please contact your surgeon's office.   FOLLOW BOWEL PREP AND ANY ADDITIONAL PRE OP INSTRUCTIONS YOU RECEIVED FROM YOUR SURGEON'S OFFICE!!!      Oral Hygiene is also important to reduce your risk of infection.                                    Remember - BRUSH YOUR TEETH THE MORNING OF SURGERY WITH YOUR REGULAR TOOTHPASTE   Take these medicines the morning of surgery with A SIP OF WATER: Tylenol, Clonazepam, Pepcid, Lamictal, Synthroid, Pantoprazole  You may not have any metal on your body including hair pins, jewelry, and body piercing             Do not wear make-up, lotions, powders, perfumes, or deodorant  Do not wear nail polish including gel and S&S, artificial/acrylic nails, or any other type of covering on natural nails including finger and toenails. If you have artificial nails, gel coating, etc. that needs to be removed by a nail salon please have this removed prior to surgery or surgery may need to be canceled/ delayed if the surgeon/ anesthesia feels like they are unable to be safely monitored.   Do not shave  48 hours prior to surgery.    Do not bring valuables to the hospital. [place].  DO NOT [place]. PHARMACY WILL DISPENSE MEDICATIONS LISTED ON YOUR MEDICATION LIST TO YOU DURING YOUR ADMISSION [place]!    Patients discharged on the day of surgery will not be allowed to drive home.  Someone NEEDS to stay with you for the first 24 hours after anesthesia.   Special Instructions: Bring a copy of your healthcare power of attorney and living will documents         the day of surgery if you haven't scanned them before.              Please read over the following fact sheets you were given: IF YOU HAVE QUESTIONS ABOUT YOUR PRE-OP INSTRUCTIONS PLEASE CALL [place] - Preparing for Surgery Before surgery, you can play an important role.  Because skin is not sterile, your skin needs to be as free of germs as possible.  You can reduce the number of germs on your skin by  washing with CHG (chlorahexidine gluconate) soap before surgery.  CHG is an antiseptic cleaner which kills germs and bonds with the skin to continue killing germs even after washing. Please DO NOT use if you have an allergy to CHG or antibacterial soaps.  If your skin becomes reddened/irritated stop using the CHG and inform your nurse when you arrive at Short Stay. Do not shave (including legs and underarms) for at least 48 hours prior to the first CHG shower.  You may shave your face/neck.  Please follow these instructions carefully:  1.  Shower with CHG Soap the night before surgery and the  morning of surgery.  2.  If you choose to wash your hair, wash your hair first as usual with your normal  shampoo.  3.  After you shampoo, rinse your hair and body thoroughly to remove the shampoo.                             4.  Use CHG as you would any other liquid soap.  You can apply chg directly to the skin and wash.  Gently with a scrungie or clean washcloth.  5.  Apply the CHG Soap to your body ONLY FROM THE NECK DOWN.   Do   not use on face/ open                           Wound or open sores. Avoid contact with eyes, ears mouth and   genitals (private parts).  Wash face,  Genitals (private parts) with your normal soap.             6.  Wash thoroughly, paying special attention to the area where your    surgery  will be performed.  7.  Thoroughly rinse your body with warm water from the neck down.  8.  DO NOT shower/wash with your normal soap after using and rinsing off the CHG Soap.                9.  Pat yourself dry with a clean towel.            10.  Wear clean pajamas.            11.  Place clean sheets on your bed the night of your first shower and do not  sleep with pets. Day of Surgery : Do not apply any lotions/deodorants the morning of surgery.  Please wear clean clothes to the hospital/surgery center.  FAILURE TO FOLLOW THESE INSTRUCTIONS MAY RESULT IN THE CANCELLATION  OF YOUR SURGERY  PATIENT SIGNATURE_________________________________  NURSE SIGNATURE__________________________________  ________________________________________________________________________   Adam Phenix  An incentive spirometer is a tool that can help keep your lungs clear and active. This tool measures how well you are filling your lungs with each breath. Taking long deep breaths may help reverse or decrease the chance of developing breathing (pulmonary) problems (especially infection) following: A long period of time when you are unable to move or be active. BEFORE THE PROCEDURE  If the spirometer includes an indicator to show your best effort, your nurse or respiratory therapist will set it to a desired goal. If possible, sit up straight or lean slightly forward. Try not to slouch. Hold the incentive spirometer in an upright position. INSTRUCTIONS FOR USE  Sit on the edge of your bed if possible, or sit up as far as you can in bed or on a chair. Hold the incentive spirometer in an upright position. Breathe out normally. Place the mouthpiece in your mouth and seal your lips tightly around it. Breathe in slowly and as deeply as possible, raising the piston or the ball toward the top of the column. Hold your breath for 3-5 seconds or for as long as possible. Allow the piston or ball to fall to the bottom of the column. Remove the mouthpiece from your mouth and breathe out normally. Rest for a few seconds and repeat Steps 1 through 7 at least 10 times every 1-2 hours when you are awake. Take your time and take a few normal breaths between deep breaths. The spirometer may include an indicator to show your best effort. Use the indicator as a goal to work toward during each repetition. After each set of 10 deep breaths, practice coughing to be sure your lungs are clear. If you have an incision (the cut made at the time of surgery), support your incision when coughing by placing a  pillow or rolled up towels firmly against it. Once you are able to get out of bed, walk around indoors and cough well. You may stop using the incentive spirometer when instructed by your caregiver.  RISKS AND COMPLICATIONS Take your time so you do not get dizzy or light-headed. If you are in pain, you may need to take or ask for pain medication before doing incentive spirometry. It is harder to take a deep breath if you are having pain. AFTER USE Rest and breathe slowly and easily. It can be helpful  to keep track of a log of your progress. Your caregiver can provide you with a simple table to help with this. If you are using the spirometer at home, follow these instructions: [place] IF:  You are having difficultly using the spirometer. You have trouble using the spirometer as often as instructed. Your pain medication is not giving enough relief while using the spirometer. You develop fever of 100.5 F (38.1 C) or higher. SEEK IMMEDIATE MEDICAL CARE IF:  You cough up bloody sputum that had not been present before. You develop fever of 102 F (38.9 C) or greater. You develop worsening pain at or near the incision site. MAKE SURE YOU:  Understand these instructions. Will watch your condition. Will get help right away if you are not doing well or get worse. Document Released: 02/06/2007 Document Revised: 12/19/2011 Document Reviewed: 04/09/2007 [facility] Patient Information 2014 [place], [place].   ________________________________________________________________________

## 2022-06-08 ENCOUNTER — Encounter (HOSPITAL_COMMUNITY): Payer: Self-pay

## 2022-06-08 ENCOUNTER — Encounter (HOSPITAL_COMMUNITY)
Admission: RE | Admit: 2022-06-08 | Discharge: 2022-06-08 | Disposition: A | Discharge status: Home / Self Care | Payer: BC Managed Care – PPO | Source: Ambulatory Visit | Attending: Orthopedic Surgery | Admitting: Orthopedic Surgery

## 2022-06-08 VITALS — BP 110/76 | HR 68 | Temp 98.2°F | Resp 12 | Ht 69.5 in | Wt 181.6 lb

## 2022-06-08 DIAGNOSIS — Z01818 Encounter for other preprocedural examination: Secondary | ICD-10-CM

## 2022-06-08 DIAGNOSIS — Z01812 Encounter for preprocedural laboratory examination: Secondary | ICD-10-CM | POA: Diagnosis not present

## 2022-06-08 HISTORY — DX: Nausea with vomiting, unspecified: R11.2

## 2022-06-08 HISTORY — DX: Anemia, unspecified: D64.9

## 2022-06-08 HISTORY — DX: Anxiety disorder, unspecified: F41.9

## 2022-06-08 HISTORY — DX: Other specified postprocedural states: Z98.890

## 2022-06-08 HISTORY — DX: Gastritis, unspecified, without bleeding: K29.70

## 2022-06-08 HISTORY — DX: Depression, unspecified: F32.A

## 2022-06-08 HISTORY — DX: Pneumonia, unspecified organism: J18.9

## 2022-06-08 LAB — CBC
HCT: 42.8 % (ref 36.0–46.0)
Hemoglobin: 13.7 g/dL (ref 12.0–15.0)
MCH: 28.7 pg (ref 26.0–34.0)
MCHC: 32 g/dL (ref 30.0–36.0)
MCV: 89.7 fL (ref 80.0–100.0)
Platelets: 219 10*3/uL (ref 150–400)
RBC: 4.77 MIL/uL (ref 3.87–5.11)
RDW: 12.2 % (ref 11.5–15.5)
WBC: 4.8 10*3/uL (ref 4.0–10.5)
nRBC: 0 % (ref 0.0–0.2)

## 2022-06-14 DIAGNOSIS — G43719 Chronic migraine without aura, intractable, without status migrainosus: Secondary | ICD-10-CM | POA: Diagnosis not present

## 2022-06-17 ENCOUNTER — Ambulatory Visit (HOSPITAL_COMMUNITY)
Admission: RE | Admit: 2022-06-17 | Discharge: 2022-06-17 | Disposition: A | Discharge status: Home / Self Care | Payer: BC Managed Care – PPO | Attending: Orthopedic Surgery | Admitting: Orthopedic Surgery

## 2022-06-17 ENCOUNTER — Other Ambulatory Visit: Payer: Self-pay

## 2022-06-17 ENCOUNTER — Ambulatory Visit (HOSPITAL_COMMUNITY): Payer: BC Managed Care – PPO | Admitting: Anesthesiology

## 2022-06-17 ENCOUNTER — Encounter (HOSPITAL_COMMUNITY)
Admission: RE | Disposition: A | Discharge status: Home / Self Care | Payer: Self-pay | Source: Home / Self Care | Attending: Orthopedic Surgery

## 2022-06-17 ENCOUNTER — Encounter (HOSPITAL_COMMUNITY): Payer: Self-pay | Admitting: Orthopedic Surgery

## 2022-06-17 DIAGNOSIS — E039 Hypothyroidism, unspecified: Secondary | ICD-10-CM | POA: Insufficient documentation

## 2022-06-17 DIAGNOSIS — F418 Other specified anxiety disorders: Secondary | ICD-10-CM | POA: Insufficient documentation

## 2022-06-17 DIAGNOSIS — F319 Bipolar disorder, unspecified: Secondary | ICD-10-CM | POA: Diagnosis not present

## 2022-06-17 DIAGNOSIS — X58XXXA Exposure to other specified factors, initial encounter: Secondary | ICD-10-CM | POA: Diagnosis not present

## 2022-06-17 DIAGNOSIS — K219 Gastro-esophageal reflux disease without esophagitis: Secondary | ICD-10-CM | POA: Diagnosis not present

## 2022-06-17 DIAGNOSIS — S76311A Strain of muscle, fascia and tendon of the posterior muscle group at thigh level, right thigh, initial encounter: Secondary | ICD-10-CM | POA: Diagnosis not present

## 2022-06-17 DIAGNOSIS — S76011A Strain of muscle, fascia and tendon of right hip, initial encounter: Secondary | ICD-10-CM | POA: Insufficient documentation

## 2022-06-17 HISTORY — PX: OPEN SURGICAL REPAIR OF GLUTEAL TENDON: SHX5995

## 2022-06-17 SURGERY — REPAIR, TENDON, GLUTEUS MEDIUS, OPEN
Anesthesia: General | Laterality: Right

## 2022-06-17 MED ORDER — KETOROLAC TROMETHAMINE 30 MG/ML IJ SOLN
INTRAMUSCULAR | Status: DC | PRN
  Administered 2022-06-17: 15 mg via INTRAVENOUS

## 2022-06-17 MED ORDER — ROCURONIUM BROMIDE 10 MG/ML (PF) SYRINGE
PREFILLED_SYRINGE | INTRAVENOUS | Status: DC | PRN
  Administered 2022-06-17: 50 mg via INTRAVENOUS

## 2022-06-17 MED ORDER — FENTANYL CITRATE (PF) 100 MCG/2ML IJ SOLN
INTRAMUSCULAR | Status: AC
  Filled 2022-06-17: qty 2

## 2022-06-17 MED ORDER — OXYCODONE HCL 5 MG PO TABS
ORAL_TABLET | ORAL | Status: AC
  Filled 2022-06-17: qty 1

## 2022-06-17 MED ORDER — TRANEXAMIC ACID-NACL 1000-0.7 MG/100ML-% IV SOLN
1000.0000 mg | INTRAVENOUS | Status: AC
  Administered 2022-06-17: 1000 mg via INTRAVENOUS
  Filled 2022-06-17: qty 100

## 2022-06-17 MED ORDER — EPHEDRINE SULFATE (PRESSORS) 50 MG/ML IJ SOLN
INTRAMUSCULAR | Status: DC | PRN
  Administered 2022-06-17 (×2): 5 mg via INTRAVENOUS

## 2022-06-17 MED ORDER — HYDROMORPHONE HCL 2 MG PO TABS: 2.0000 mg | ORAL_TABLET | Freq: Four times a day (QID) | ORAL | 0 refills | Status: AC | PRN

## 2022-06-17 MED ORDER — 0.9 % SODIUM CHLORIDE (POUR BTL) OPTIME
TOPICAL | Status: DC | PRN
  Administered 2022-06-17: 1000 mL

## 2022-06-17 MED ORDER — FENTANYL CITRATE PF 50 MCG/ML IJ SOSY
PREFILLED_SYRINGE | INTRAMUSCULAR | Status: AC
  Filled 2022-06-17: qty 3

## 2022-06-17 MED ORDER — FENTANYL CITRATE (PF) 100 MCG/2ML IJ SOLN
INTRAMUSCULAR | Status: DC | PRN
Start: 2022-06-17 — End: 2022-06-17
  Administered 2022-06-17: 25 ug via INTRAVENOUS
  Administered 2022-06-17: 75 ug via INTRAVENOUS

## 2022-06-17 MED ORDER — ORAL CARE MOUTH RINSE: 15.0000 mL | Freq: Once | OROMUCOSAL | Status: AC

## 2022-06-17 MED ORDER — KETOROLAC TROMETHAMINE 30 MG/ML IJ SOLN
30.0000 mg | Freq: Once | INTRAMUSCULAR | Status: DC | PRN
Start: 2022-06-17 — End: 2022-06-17

## 2022-06-17 MED ORDER — SCOPOLAMINE 1 MG/3DAYS TD PT72
MEDICATED_PATCH | TRANSDERMAL | Status: AC
  Filled 2022-06-17: qty 1

## 2022-06-17 MED ORDER — ONDANSETRON HCL 4 MG PO TABS: 4.0000 mg | ORAL_TABLET | Freq: Three times a day (TID) | ORAL | 0 refills | Status: DC | PRN

## 2022-06-17 MED ORDER — DEXAMETHASONE SODIUM PHOSPHATE 4 MG/ML IJ SOLN
INTRAMUSCULAR | Status: DC | PRN
  Administered 2022-06-17: 8 mg via INTRAVENOUS

## 2022-06-17 MED ORDER — CHLORHEXIDINE GLUCONATE 0.12 % MT SOLN
15.0000 mL | Freq: Once | OROMUCOSAL | Status: AC
  Administered 2022-06-17: 15 mL via OROMUCOSAL

## 2022-06-17 MED ORDER — LACTATED RINGERS IV SOLN: INTRAVENOUS | Status: DC

## 2022-06-17 MED ORDER — TRAMADOL HCL 50 MG PO TABS: 50.0000 mg | ORAL_TABLET | Freq: Four times a day (QID) | ORAL | 0 refills | Status: DC | PRN

## 2022-06-17 MED ORDER — PROPOFOL 10 MG/ML IV BOLUS
INTRAVENOUS | Status: AC
  Filled 2022-06-17: qty 20

## 2022-06-17 MED ORDER — CEFAZOLIN SODIUM-DEXTROSE 2-4 GM/100ML-% IV SOLN
2.0000 g | INTRAVENOUS | Status: AC
  Administered 2022-06-17: 2 g via INTRAVENOUS
  Filled 2022-06-17: qty 100

## 2022-06-17 MED ORDER — ACETAMINOPHEN 325 MG PO TABS: 325.0000 mg | ORAL_TABLET | ORAL | Status: DC | PRN

## 2022-06-17 MED ORDER — LIDOCAINE 2% (20 MG/ML) 5 ML SYRINGE
INTRAMUSCULAR | Status: DC | PRN
  Administered 2022-06-17: 100 mg via INTRAVENOUS

## 2022-06-17 MED ORDER — MIDAZOLAM HCL 5 MG/5ML IJ SOLN
INTRAMUSCULAR | Status: DC | PRN
  Administered 2022-06-17: 1 mg via INTRAVENOUS

## 2022-06-17 MED ORDER — SUGAMMADEX SODIUM 200 MG/2ML IV SOLN
INTRAVENOUS | Status: DC | PRN
  Administered 2022-06-17: 200 mg via INTRAVENOUS

## 2022-06-17 MED ORDER — MEPERIDINE HCL 50 MG/ML IJ SOLN: 6.2500 mg | INTRAMUSCULAR | Status: DC | PRN

## 2022-06-17 MED ORDER — OXYCODONE HCL 5 MG PO TABS
5.0000 mg | ORAL_TABLET | Freq: Once | ORAL | Status: AC
  Administered 2022-06-17: 5 mg via ORAL

## 2022-06-17 MED ORDER — BUPIVACAINE-EPINEPHRINE (PF) 0.25% -1:200000 IJ SOLN
INTRAMUSCULAR | Status: AC
  Filled 2022-06-17: qty 30

## 2022-06-17 MED ORDER — FENTANYL CITRATE PF 50 MCG/ML IJ SOSY
25.0000 ug | PREFILLED_SYRINGE | INTRAMUSCULAR | Status: DC | PRN
  Administered 2022-06-17 (×2): 50 ug via INTRAVENOUS

## 2022-06-17 MED ORDER — ACETAMINOPHEN 160 MG/5ML PO SOLN: 325.0000 mg | ORAL | Status: DC | PRN

## 2022-06-17 MED ORDER — ONDANSETRON HCL 4 MG/2ML IJ SOLN
INTRAMUSCULAR | Status: DC | PRN
  Administered 2022-06-17: 4 mg via INTRAVENOUS

## 2022-06-17 MED ORDER — MIDAZOLAM HCL 2 MG/2ML IJ SOLN
INTRAMUSCULAR | Status: AC
  Filled 2022-06-17: qty 2

## 2022-06-17 MED ORDER — PROPOFOL 10 MG/ML IV BOLUS
INTRAVENOUS | Status: DC | PRN
  Administered 2022-06-17: 150 mg via INTRAVENOUS

## 2022-06-17 MED ORDER — BUPIVACAINE-EPINEPHRINE (PF) 0.25% -1:200000 IJ SOLN
INTRAMUSCULAR | Status: DC | PRN
  Administered 2022-06-17: 30 mL

## 2022-06-17 MED ORDER — ONDANSETRON HCL 4 MG/2ML IJ SOLN: 4.0000 mg | Freq: Once | INTRAMUSCULAR | Status: DC | PRN

## 2022-06-17 MED ORDER — SCOPOLAMINE 1 MG/3DAYS TD PT72
MEDICATED_PATCH | TRANSDERMAL | Status: DC | PRN
  Administered 2022-06-17 (×2): 1 via TRANSDERMAL

## 2022-06-17 SURGICAL SUPPLY — 45 items
ANCH SUT 2.6 FBRSTK 1.7 (Anchor) ×1 IMPLANT
ANCH SUT 2.6 FBRTK 1.7 KNTLS (Anchor) ×1 IMPLANT
ANCH SUT SWLK 19.1X4.75 (Anchor) ×1 IMPLANT
ANCHOR SUT BIO SW 4.75X19.1 (Anchor) IMPLANT
ANCHOR SUT FBRTK 2.6X1.7X2 (Anchor) IMPLANT
BAG COUNTER SPONGE SURGICOUNT (BAG) IMPLANT
BAG SPEC THK2 15X12 ZIP CLS (MISCELLANEOUS) ×1
BAG SPNG CNTER NS LX DISP (BAG)
BAG ZIPLOCK 12X15 (MISCELLANEOUS) ×1 IMPLANT
BIO-TENODESIS DISP KIT ×1 IMPLANT
COVER SURGICAL LIGHT HANDLE (MISCELLANEOUS) ×1 IMPLANT
DRAPE HIP W/POCKET STRL (MISCELLANEOUS) ×1 IMPLANT
DRAPE ORTHO SPLIT 77X108 STRL (DRAPES) ×1
DRAPE SURG 17X11 SM STRL (DRAPES) ×1 IMPLANT
DRAPE SURG ORHT 6 SPLT 77X108 (DRAPES) ×1 IMPLANT
DRAPE U-SHAPE 47X51 STRL (DRAPES) ×1 IMPLANT
DRSG AQUACEL AG ADV 3.5X10 (GAUZE/BANDAGES/DRESSINGS) IMPLANT
DRSG EMULSION OIL 3X16 NADH (GAUZE/BANDAGES/DRESSINGS) ×1 IMPLANT
DURAPREP 26ML APPLICATOR (WOUND CARE) ×1 IMPLANT
ELECT REM PT RETURN 15FT ADLT (MISCELLANEOUS) ×1 IMPLANT
GLOVE BIOGEL PI IND STRL 8 (GLOVE) ×2 IMPLANT
GOWN STRL REUS W/ TWL XL LVL3 (GOWN DISPOSABLE) ×2 IMPLANT
GOWN STRL REUS W/TWL XL LVL3 (GOWN DISPOSABLE) ×2
IMPL FIBERTAK KNTLS 2.6 (Anchor) IMPLANT
IMPLANT FIBERTAK KNTLS 2.6 (Anchor) ×1 IMPLANT
KIT BASIN OR (CUSTOM PROCEDURE TRAY) ×1 IMPLANT
KIT BIO-TENODESIS 3X8 DISP (MISCELLANEOUS) ×1
KIT INSRT BABSR STRL DISP BTN (MISCELLANEOUS) IMPLANT
KIT TURNOVER KIT A (KITS) IMPLANT
MANIFOLD NEPTUNE II (INSTRUMENTS) ×1 IMPLANT
NDL TAPERED W/ NITINOL LOOP (MISCELLANEOUS) IMPLANT
NEEDLE HYPO 22GX1.5 SAFETY (NEEDLE) ×1 IMPLANT
NEEDLE TAPERED W/ NITINOL LOOP (MISCELLANEOUS) ×1 IMPLANT
NS IRRIG 1000ML POUR BTL (IV SOLUTION) ×1 IMPLANT
PACK TOTAL JOINT (CUSTOM PROCEDURE TRAY) ×1 IMPLANT
PROTECTOR NERVE ULNAR (MISCELLANEOUS) ×1 IMPLANT
STAPLER VISISTAT (STAPLE) IMPLANT
STRIP CLOSURE SKIN 1/2X4 (GAUZE/BANDAGES/DRESSINGS) IMPLANT
SUT MNCRL AB 3-0 PS2 18 (SUTURE) ×1 IMPLANT
SUT STRATAFIX 1PDS 45CM VIOLET (SUTURE) ×1 IMPLANT
SUT VIC AB 2-0 CT1 27 (SUTURE) ×2
SUT VIC AB 2-0 CT1 TAPERPNT 27 (SUTURE) ×2 IMPLANT
SYR 30ML LL (SYRINGE) ×1 IMPLANT
TOWEL OR 17X26 10 PK STRL BLUE (TOWEL DISPOSABLE) ×2 IMPLANT
WATER STERILE IRR 1000ML POUR (IV SOLUTION) ×1 IMPLANT

## 2022-06-17 NOTE — Brief Op Note (Signed)
06/17/2022  11:42 AM  PATIENT:  Patricia Adkins  51 y.o. female  PRE-OPERATIVE DIAGNOSIS:  Right hip gluteus medius tear  POST-OPERATIVE DIAGNOSIS:  Right hip gluteus medius tear  PROCEDURE:  Procedure(s) with comments: RIGHT HIP OPEN REPAIR OF GLUTEAL MEDIUS (Right) - 120  SURGEON:  Surgeon(s) and Role:    * Stann Mainland, Elly Modena, MD - Primary  PHYSICIAN ASSISTANT: Jonelle Sidle, PA-C   ANESTHESIA:   local and general  EBL:  15 mL   BLOOD ADMINISTERED:none  DRAINS: none   LOCAL MEDICATIONS USED:  MARCAINE     SPECIMEN:  No Specimen  DISPOSITION OF SPECIMEN:  N/A  COUNTS:  YES  TOURNIQUET:  * No tourniquets in log *  DICTATION: .Note written in EPIC  PLAN OF CARE: Discharge to home after PACU  PATIENT DISPOSITION:  PACU - hemodynamically stable.   Delay start of Pharmacological VTE agent (>24hrs) due to surgical blood loss or risk of bleeding: not applicable

## 2022-06-17 NOTE — Op Note (Signed)
INDICATIONS: Patricia Adkins is a 51 y.o.-year-old female with a right sided high-grade gluteus medius and minimus tendon tear.  She has had exhaustive conservative treatment with therapies and injections and oral medications.  She is here today for open repair of the gluteus medius and minimus tendon tears.  After discussion of the risk of bleeding, infection, damage to surrounding nerves and vessels, stiffness, failure of repair, need for revision surgery, development of DVT as well as the risk of anesthesia she has provided informed consent.;  The patient did consent to the procedure after discussion of the risks and benefits.   PREOPERATIVE DIAGNOSIS: Right hip gluteus medius tear   POSTOPERATIVE DIAGNOSIS:   1.  Right hip gluteus medius tendon tear 2.  Right hip gluteus minimus tendon tear   PROCEDURE: Open repair of gluteus medius and minimus tendon tears right hip   SURGEON: Geralynn Rile, M.D.   ASSIST: Jonelle Sidle, PA-C.   Assistant attestation:  PA Thereasa Solo was present for the entire procedure.   ANESTHESIA:  general, local with 1/4% Marcaine with epinephrine   IV FLUIDS AND URINE: See anesthesia.   ESTIMATED BLOOD LOSS: 25 mL.   IMPLANTS: Arthrex 2.6 mm knotless fiber tack suture x2 Arthrex 4.75 mm swivel lock anchor x1   DRAINS: None   COMPLICATIONS: None.   DESCRIPTION OF PROCEDURE: The patient was brought to the operating room and placed supine on the operating table.  The patient had been signed prior to the procedure and this was documented. The patient had the anesthesia placed by the anesthesiologist.  A time-out was performed to confirm that this was the correct patient, site, side and location. The patient did receive antibiotics prior to the incision and was re-dosed during the procedure as needed at indicated intervals.  Patient was then placed in the left lateral decubitus position with an axillary roll and a beanbag.  The patient had the operative extremity prepped  and draped in the standard surgical fashion.      We began the procedure by performing a direct lateral approach to the proximal femur at the greater trochanter.  Dissection was carried down through skin and subcutaneous fat to the layer of the iliotibial band.  The iliotibial band was then split in line with its fibers sharply.  We then bluntly divided the gluteus maximus just a bit posteriorly to have better ability to retract.  We then placed deep retractors and identified the greater trochanter.  We then identified the area of the tear that corresponded with the anterior aspect of the greater trochanter.  We biologically prepared the greater trochanter for our repair.  We did note some delamination between the gluteus minimus and medius.  We utilized a microfracture awl to biologically prepare through the tendon and the greater trochanteric bone.   We then placed 2 anchors with a 2.6 mm knotless fiber tack anchors.  We then used a free needle to sew back into the anterior aspect of the tear including the minimus and medius in the same pass in an anterior aspect and then subsequently in the posterior aspect of the tear with the posteriorly placed anchor.  We then utilized the knotless mechanism of the anterior and posterior 2.6 mm fiber tack anchors to achieve a double pulley technique with #2 sutures coming from each anchor respectively.  Care was taken to hold the tendon in a reduced position as we secured the medial row. we then passed these through the loop suture and compressed the tendon  at the level of the suture anchors.  Next, we loaded the 4 tails of the fiber tape sutures into a 4.75 mm swivel lock anchor.  We selected a position distal on the trochanter but anterior to the vastus lateralis.  We placed a awl punch to prepare the canal.  We then delivered the anchor through that and had excellent footprint compression of the tendon at that level.  We then cut the free ends of the sutures.  This  completed our repair.   Next we copiously irrigated the wound with normal saline.  We then repaired the iliotibial band with #1 strata fix in running locking fashion.  We then irrigated once again.  We then administered local anesthetic in the field with quarter percent Marcaine with epinephrine.  We then closed the fat layer with #1 Vicryl.  Skin was closed with 2-0 Vicryl for the dermal layer and running subcuticular 3-0 Monocryl for the skin with Steri-Strips and standard sterile bandage.   Patient tolerated the procedure well with no noted complications.  All counts were correct x2.  She was transported to PACU in stable condition.     POSTOPERATIVE PLAN:  Patient will be weightbearing as tolerated to the right lower extremity but will use crutches for the first 4 weeks.  She will avoid active abduction and full weightbearing on the operative extremity.  She will take 81 mg aspirin once per day for 6 weeks for DVT prophylaxis.  I will see her back in the office in 2 weeks.  She will discharge home today from PACU.

## 2022-06-17 NOTE — Anesthesia Preprocedure Evaluation (Signed)
Anesthesia Evaluation  Patient identified by MRN, date of birth, ID band Patient awake    Reviewed: Allergy & Precautions, NPO status , Patient's Chart, lab work & pertinent test results  History of Anesthesia Complications (+) PONV  Airway Mallampati: I       Dental no notable dental hx.    Pulmonary    Pulmonary exam normal        Cardiovascular negative cardio ROS Normal cardiovascular exam     Neuro/Psych  Headaches, PSYCHIATRIC DISORDERS Anxiety Depression Bipolar Disorder    GI/Hepatic Neg liver ROS, GERD  Medicated,  Endo/Other  Hypothyroidism   Renal/GU negative Renal ROS  negative genitourinary   Musculoskeletal   Abdominal Normal abdominal exam  (+)   Peds  Hematology negative hematology ROS (+)   Anesthesia Other Findings   Reproductive/Obstetrics                             Anesthesia Physical Anesthesia Plan  ASA: 2  Anesthesia Plan: General   Post-op Pain Management: Minimal or no pain anticipated   Induction: Intravenous  PONV Risk Score and Plan: 4 or greater and Ondansetron, Dexamethasone and Midazolam  Airway Management Planned: Oral ETT  Additional Equipment: None  Intra-op Plan:   Post-operative Plan: Extubation in OR  Informed Consent: I have reviewed the patients History and Physical, chart, labs and discussed the procedure including the risks, benefits and alternatives for the proposed anesthesia with the patient or authorized representative who has indicated his/her understanding and acceptance.     Dental advisory given  Plan Discussed with: CRNA  Anesthesia Plan Comments:         Anesthesia Quick Evaluation

## 2022-06-17 NOTE — Anesthesia Postprocedure Evaluation (Signed)
Anesthesia Post Note  Patient: Patricia Adkins  Procedure(s) Performed: RIGHT HIP OPEN REPAIR OF GLUTEAL MEDIUS (Right)     Patient location during evaluation: PACU Anesthesia Type: General Level of consciousness: awake Pain management: pain level controlled Vital Signs Assessment: post-procedure vital signs reviewed and stable Respiratory status: spontaneous breathing Cardiovascular status: stable Postop Assessment: no apparent nausea or vomiting Anesthetic complications: no   No notable events documented.  Last Vitals:  Vitals:   06/17/22 1230 06/17/22 1245  BP: 128/70 123/73  Pulse: 64 (!) 58  Resp: 17 15  Temp:    SpO2: 100% 100%    Last Pain:  Vitals:   06/17/22 1300  TempSrc:   PainSc: [place] Jr

## 2022-06-17 NOTE — Transfer of Care (Signed)
Immediate Anesthesia Transfer of Care Note  Patient: Patricia Adkins  Procedure(s) Performed: Procedure(s) with comments: RIGHT HIP OPEN REPAIR OF GLUTEAL MEDIUS (Right) - 120  Patient Location: PACU  Anesthesia Type:General  Level of Consciousness: Patient easily awoken, sedated, comfortable, cooperative, following commands, responds to stimulation.   Airway & Oxygen Therapy: Patient spontaneously breathing, ventilating well, oxygen via simple oxygen mask.  Post-op Assessment: Report given to PACU RN, vital signs reviewed and stable, moving all extremities.   Post vital signs: Reviewed and stable.  Complications: No apparent anesthesia complications  Last Vitals:  Vitals Value Taken Time  BP 131/81 06/17/22 1151  Temp    Pulse 78 06/17/22 1153  Resp 14 06/17/22 1153  SpO2 100 % 06/17/22 1153  Vitals shown include unvalidated device data.  Last Pain:  Vitals:   06/17/22 0834  TempSrc:   PainSc: 4          Complications: No notable events documented.

## 2022-06-17 NOTE — Discharge Instructions (Addendum)
Orthopedic surgery discharge instructions:  -You are to be 50%partial weightbearing to the right lower extremity with crutch assistance for the next 4 weeks.  You should also avoid active abduction of the right hip (avoid moving the right leg away from midline).  -Your postoperative bandage is waterproof.  He may begin showering starting on postoperative day #1.  Please do not submerge underwater.  He should maintain a postoperative bandage until your 2-week follow-up appointment.  -Apply ice to the right hip for 20 to 30 minutes out of each hour.  Do this around-the-clock as often as possible for the first 72 hours of surgery.  -For mild to moderate pain use Tylenol and Advil in an alternating fashion every 6 hours respectively.  For breakthrough pain use tramadol or dilaudid as necessary.  -For the prevention of blood clots take an 81 mg aspirin, enteric-coated, once daily for 6 weeks.  -Follow-up with Dr. Stann Mainland in 2 weeks for routine postoperative check.

## 2022-06-17 NOTE — H&P (Signed)
ORTHOPAEDIC H and P  REQUESTING PHYSICIAN: Nicholes Stairs, MD  PCP:  Aura Dials, MD  Chief Complaint: Right gluteus minimus and medius tear  HPI: Patricia Adkins is a 51 y.o. female who complains of right hip pain and weakness.  She has had exhaustive conservative treatment with injections and therapies and other modalities.  She continues with hip pain and symptoms consistent with gluteus tendon pathology.  She is here today for open repair.  No new complaints at this time.  Past Medical History:  Diagnosis Date   Anemia    Anxiety    Atypical mole 08/03/2006   Left Abdomen (marked) (excision)   Atypical mole 08/03/2006   Lower Back (marked) (excision)   Atypical mole 05/23/2012   Left Low Back (moderate) (wider shave)   Atypical mole 06/28/2012   Left Paraspinal (moderate) (wider shave)   Atypical mole 06/28/2012   Right Lateral Back (mild)   Atypical mole 06/28/2012   Right Upper Buttock (severe) (wider shave)   Atypical mole 06/28/2012   Left Upper Arm (severe) (wider shave)   Atypical mole 07/19/2013   Left Post Shoulder Superior (moderate)   Atypical mole 07/19/2013   Left Post Shoulder Inferior (mild)   Atypical mole 07/19/2013   Right Migh Thigh (moderate)   Atypical mole 05/30/2014   Left Chin (mild)   Atypical mole 05/30/2014   Lower Abdomen (moderate)   Atypical mole 05/30/2014   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 Toy Care)- Started after second pregnancy   Depression     Gastritis    Hypothyroidism    Lumbar degenerative disc disease    Migraine    headaches (Dr. Catalina Gravel)   Pneumonia    PONV (postoperative nausea and vomiting)    Thyroid disease    Past Surgical History:  Procedure Laterality Date   ABDOMINAL HYSTERECTOMY     CESAREAN SECTION  10/10/1996   CYSTECTOMY     Uterine   DILATION AND CURETTAGE OF UTERUS     PLANTAR FASCIA SURGERY Right    US ECHOCARDIOGRAPHY     Normal EF, mild MR/TR, Normal LV size and function. There were no regional wall motion abnormailities. Left Ventricular ejection fraction estimated by 2D at 60-65%. Mild mitral valve regurgitation. Mild tricuspid regurgitation   Social History   Socioeconomic History   Marital status: Married    Spouse name: Not on file   Number of children: Not on file   Years of education: Not on file   Highest education level: Not on file  Occupational History   Not on file  Tobacco Use   Smoking status: Never   Smokeless tobacco: Never  Vaping Use   Vaping Use: Never used  Substance and Sexual Activity   Alcohol use: No   Drug use: No   Sexual activity: Not on file  Other Topics Concern   Not on file  Social History Narrative   Not on file   Social Determinants of  Health   Financial Resource Strain: Not on file  Food Insecurity: Not on file  Transportation Needs: Not on file  Physical Activity: Not on file  Stress: Not on file  Social Connections: Not on file   Family History  Problem Relation Age of Onset   Arthritis Mother    Hypertension Father        at a young age,    Hyperlipidemia Father    Hypertension Sister    Allergies  Allergen Reactions   Sumatriptan Anaphylaxis    Other reaction(s): Other (See Comments) Throat swelling and chest tightness   Codeine Nausea And Vomiting   Depakote [Divalproex Sodium] Swelling   Morphine And Related Nausea And Vomiting   Sulfa Antibiotics     Stomach upset: Side Effects   Prior to Admission medications    Medication Sig Start Date End Date Taking? Authorizing Provider  acetaminophen (TYLENOL) 325 MG tablet Take 650 mg by mouth every 6 (six) hours as needed for moderate pain.   Yes [provider]  AIMOVIG 140 MG/ML SOAJ Inject 140 mg into the skin every 30 (thirty) days. 04/08/22  Yes [provider]  Bacillus Coagulans-Inulin (ALIGN PREBIOTIC-PROBIOTIC PO) Take 2 Pieces by mouth daily.   Yes [provider]  Boswellia-Glucosamine-Vit D (OSTEO BI-FLEX ONE PER DAY PO) Take 1 tablet by mouth daily.   Yes [provider]  CAPLYTA 21 MG CAPS Take 21 mg by mouth at bedtime. 12/31/21  Yes [provider]  Cholecalciferol (VITAMIN D) 50 MCG (2000 UT) CAPS Take by mouth.   Yes [provider]  clonazePAM (KLONOPIN) 1 MG tablet Take 1 mg by mouth 3 (three) times daily.   Yes [provider]  diclofenac Sodium (VOLTAREN) 1 % GEL Apply 2 g topically daily as needed (PAIN).   Yes [provider]  famotidine (PEPCID) 20 MG tablet Take 20 mg by mouth daily.   Yes [provider]  fluticasone (FLONASE) 50 MCG/ACT nasal spray Place 1 spray into both nostrils daily. 08/11/17  Yes [provider]  lamoTRIgine (LAMICTAL) 200 MG tablet Take 200 mg by mouth daily. 12/20/17  Yes [provider]  levothyroxine (SYNTHROID) 175 MCG tablet Take 175 mcg by mouth daily before breakfast.   Yes [provider]  METRONIDAZOLE, TOPICAL, 0.75 % LOTN Apply 1 Application topically at bedtime. 04/27/22  Yes Lavonna Monarch, MD  Multiple Vitamin (MULTI-VITAMIN) tablet Take 1 tablet by mouth daily.   Yes [provider]  Multiple Vitamins-Minerals (AIRBORNE GUMMIES PO) Take 3 Pieces by mouth daily.   Yes [provider]  pantoprazole (PROTONIX) 40 MG tablet Take 40 mg by mouth daily. 10/26/20  Yes [provider]  PEPPERMINT OIL PO Take 2 capsules by mouth daily.   Yes [provider]  Rimegepant  Sulfate (NURTEC) 75 MG TBDP Take 75 mg by mouth daily as needed (migraine). 07/20/20  Yes [provider]  TURMERIC-GINGER-BLACK PEPPER PO Take 1 capsule by mouth daily.   Yes [provider]  estradiol (ESTRACE) 0.1 MG/GM vaginal cream     [provider]   No results found.  Positive ROS: All other systems have been reviewed and were otherwise negative with the exception of those mentioned in the HPI and as above.  Physical Exam: General: Alert, no acute distress Cardiovascular: No pedal edema Respiratory: No cyanosis, no use of accessory musculature GI: No organomegaly, abdomen is soft and non-tender Skin: No lesions in the area of chief complaint Neurologic:  Sensation intact distally Psychiatric: Patient is competent for consent with normal mood and affect Lymphatic: No axillary or cervical lymphadenopathy  MUSCULOSKELETAL: Right lower extremity is warm and well-perfused with no open wounds or lesions.  Neurovascular intact throughout.  Assessment: 1.  Right hip gluteus minimus tear  2.  Right hip gluteus medius tear  Plan: Plan to proceed today with open repair of the gluteal abductor tendons.  We again discussed the risk and benefits of the procedure in detail including but not limited to bleeding, infection, damage to surrounding nerves and vessels, stiffness, failure of repair, persistent pain, heterotopic ossification, wound breakdown, DVT, the risk of anesthesia.  She has provided informed consent.  Postoperatively she will be discharged home from PACU.  She will be on abduction restriction precautions and will be on crutch assisted or walker assisted weightbearing.  She will follow-up with me in 2 weeks.    Nicholes Stairs, MD Cell [phone]    06/17/2022 9:28 AM

## 2022-06-17 NOTE — Anesthesia Procedure Notes (Signed)
Procedure Name: Intubation Date/Time: 06/17/2022 10:20 AM  Performed by: Deliah Boston, CRNAPre-anesthesia Checklist: Patient identified, Emergency Drugs available, Suction available and Patient being monitored Patient Re-evaluated:Patient Re-evaluated prior to induction Oxygen Delivery Method: Circle system utilized Preoxygenation: Pre-oxygenation with 100% oxygen Induction Type: IV induction Ventilation: Mask ventilation without difficulty Laryngoscope Size: Mac and 3 Grade View: Grade I Tube type: Oral Number of attempts: 1 Airway Equipment and Method: Stylet Placement Confirmation: ETT inserted through vocal cords under direct vision, positive ETCO2 and breath sounds checked- equal and bilateral Secured at: 21 cm Tube secured with: Tape Dental Injury: Teeth and Oropharynx as per pre-operative assessment

## 2022-06-20 ENCOUNTER — Encounter (HOSPITAL_COMMUNITY): Payer: Self-pay | Admitting: Orthopedic Surgery

## 2022-06-29 ENCOUNTER — Encounter (HOSPITAL_COMMUNITY): Payer: Self-pay | Admitting: Orthopedic Surgery

## 2022-07-04 DIAGNOSIS — S76012S Strain of muscle, fascia and tendon of left hip, sequela: Secondary | ICD-10-CM | POA: Diagnosis not present

## 2022-07-04 DIAGNOSIS — M7602 Gluteal tendinitis, left hip: Secondary | ICD-10-CM | POA: Diagnosis not present

## 2022-07-04 DIAGNOSIS — Z4789 Encounter for other orthopedic aftercare: Secondary | ICD-10-CM | POA: Diagnosis not present

## 2022-07-06 DIAGNOSIS — S76012S Strain of muscle, fascia and tendon of left hip, sequela: Secondary | ICD-10-CM | POA: Diagnosis not present

## 2022-07-06 DIAGNOSIS — M7602 Gluteal tendinitis, left hip: Secondary | ICD-10-CM | POA: Diagnosis not present

## 2022-07-06 DIAGNOSIS — Z4789 Encounter for other orthopedic aftercare: Secondary | ICD-10-CM | POA: Diagnosis not present

## 2022-07-19 DIAGNOSIS — Z4789 Encounter for other orthopedic aftercare: Secondary | ICD-10-CM | POA: Diagnosis not present

## 2022-07-19 DIAGNOSIS — M7602 Gluteal tendinitis, left hip: Secondary | ICD-10-CM | POA: Diagnosis not present

## 2022-07-19 DIAGNOSIS — S76012S Strain of muscle, fascia and tendon of left hip, sequela: Secondary | ICD-10-CM | POA: Diagnosis not present

## 2022-07-20 ENCOUNTER — Ambulatory Visit: Payer: BC Managed Care – PPO | Admitting: Psychiatry

## 2022-07-21 DIAGNOSIS — Z4789 Encounter for other orthopedic aftercare: Secondary | ICD-10-CM | POA: Diagnosis not present

## 2022-07-21 DIAGNOSIS — M7602 Gluteal tendinitis, left hip: Secondary | ICD-10-CM | POA: Diagnosis not present

## 2022-07-21 DIAGNOSIS — S76012S Strain of muscle, fascia and tendon of left hip, sequela: Secondary | ICD-10-CM | POA: Diagnosis not present

## 2022-07-25 NOTE — Progress Notes (Unsigned)
New Patient Note  RE: Patricia Adkins MRN: [id number] DOB: May 26, 1971 Date of Office Visit: 07/26/2022  Consult requested by: Arta Silence, MD Primary care provider: Aura Dials, MD  Chief Complaint: No chief complaint on file.  History of Present Illness: I had the pleasure of seeing Patricia Adkins for initial evaluation at the [facility] on 07/25/2022. She is a 51 y.o. female, who is referred here by Aura Dials, MD for the evaluation of GI issues.  She reports food allergy to ***. The reaction occurred at the age of ***, after she ate *** amount of ***. Symptoms started within *** and was in the form of *** hives, swelling, wheezing, abdominal pain, diarrhea, vomiting. ***Denies any associated cofactors such as exertion, infection, NSAID use, or alcohol consumption. The symptoms lasted for ***. She was evaluated in ED and received ***. Since this episode, she does *** not report other accidental exposures to ***. She does *** not have access to epinephrine autoinjector and *** needed to use it.   Past work up includes: ***. Dietary History: patient has been eating other foods including ***milk, ***eggs, ***peanut, ***treenuts, ***sesame, ***shellfish, ***fish, ***soy, ***wheat, ***meats, ***fruits and ***vegetables.  She reports reading labels and avoiding *** in diet completely. She tolerates ***baked egg and baked milk products.   Assessment and Plan: Patricia Adkins is a 51 y.o. female with: No problem-specific Assessment & Plan notes found for this encounter.  No follow-ups on file.  No orders of the defined types were placed in this encounter.  Lab Orders  No laboratory test(s) ordered today    Other allergy screening: Asthma: {Blank single:19197::"yes","no"} Rhino conjunctivitis: {Blank single:19197::"yes","no"} Food allergy: {Blank single:19197::"yes","no"} Medication allergy: {Blank single:19197::"yes","no"} Hymenoptera allergy: {Blank  single:19197::"yes","no"} Urticaria: {Blank single:19197::"yes","no"} Eczema:{Blank single:19197::"yes","no"} History of recurrent infections suggestive of immunodeficency: {Blank single:19197::"yes","no"}  Diagnostics: Spirometry:  Tracings reviewed. Her effort: {Blank single:19197::"Good reproducible efforts.","It was hard to get consistent efforts and there is a question as to whether this reflects a maximal maneuver.","Poor effort, data can not be interpreted."} FVC: ***L FEV1: ***L, ***% predicted FEV1/FVC ratio: ***% Interpretation: {Blank single:19197::"Spirometry consistent with mild obstructive disease","Spirometry consistent with moderate obstructive disease","Spirometry consistent with severe obstructive disease","Spirometry consistent with possible restrictive disease","Spirometry consistent with mixed obstructive and restrictive disease","Spirometry uninterpretable due to technique","Spirometry consistent with normal pattern","No overt abnormalities noted given today's efforts"}.  Please see scanned spirometry results for details.  Skin Testing: {Blank single:19197::"Select foods","Environmental allergy panel","Environmental allergy panel and select foods","Food allergy panel","None","Deferred due to recent antihistamines use"}. *** Results discussed with patient/family.   Past Medical History: There are no problems to display for this patient.  Past Medical History:  Diagnosis Date   Anemia    Anxiety    Atypical mole 08/03/2006   Left Abdomen (marked) (excision)   Atypical mole 08/03/2006   Lower Back (marked) (excision)   Atypical mole 05/23/2012   Left Low Back (moderate) (wider shave)   Atypical mole 06/28/2012   Left Paraspinal (moderate) (wider shave)   Atypical mole 06/28/2012   Right Lateral Back (mild)   Atypical mole 06/28/2012   Right Upper Buttock (severe) (wider shave)   Atypical mole 06/28/2012   Left Upper Arm (severe) (wider shave)   Atypical mole  07/19/2013   Left Post Shoulder Superior (moderate)   Atypical mole 07/19/2013   Left Post Shoulder Inferior (mild)   Atypical mole 07/19/2013   Right Migh Thigh (moderate)   Atypical mole 05/30/2014   Left Chin (mild)   Atypical  mole 05/30/2014   Lower Abdomen (moderate)   Atypical mole 05/30/2014   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 Toy Care)- Started after second pregnancy   Depression    Gastritis    Hypothyroidism    Lumbar degenerative disc disease    Migraine    headaches (Dr. Catalina Gravel)   Pneumonia    PONV (postoperative nausea and vomiting)    Thyroid disease    Past Surgical History: Past Surgical History:  Procedure Laterality Date   ABDOMINAL HYSTERECTOMY     CESAREAN SECTION  10/10/1996   CYSTECTOMY     Uterine   DILATION AND CURETTAGE OF UTERUS     OPEN SURGICAL REPAIR OF GLUTEAL TENDON Right 06/17/2022   Procedure: RIGHT HIP OPEN REPAIR OF GLUTEAL MEDIUS;  Surgeon: Nicholes Stairs, MD;  Location: [facility];  Service: Orthopedics;  Laterality: Right;  120   PLANTAR FASCIA SURGERY Right    US ECHOCARDIOGRAPHY     Normal EF, mild MR/TR, Normal LV size and function. There were no regional wall motion abnormailities. Left Ventricular ejection fraction estimated by 2D at 60-65%. Mild mitral valve regurgitation. Mild tricuspid regurgitation   Medication List:  Current Outpatient Medications  Medication Sig Dispense Refill   acetaminophen (TYLENOL) 325 MG tablet Take 650 mg by mouth every 6 (six)  hours as needed for moderate pain.     AIMOVIG 140 MG/ML SOAJ Inject 140 mg into the skin every 30 (thirty) days.     Bacillus Coagulans-Inulin (ALIGN PREBIOTIC-PROBIOTIC PO) Take 2 Pieces by mouth daily.     Boswellia-Glucosamine-Vit D (OSTEO BI-FLEX ONE PER DAY PO) Take 1 tablet by mouth daily.     CAPLYTA 21 MG CAPS Take 21 mg by mouth at bedtime.     Cholecalciferol (VITAMIN D) 50 MCG (2000 UT) CAPS Take by mouth.     clonazePAM (KLONOPIN) 1 MG tablet Take 1 mg by mouth 3 (three) times daily.     diclofenac Sodium (VOLTAREN) 1 % GEL Apply 2 g topically daily as needed (PAIN).     estradiol (ESTRACE) 0.1 MG/GM vaginal cream      famotidine (PEPCID) 20 MG tablet Take 20 mg by mouth daily.     fluticasone (FLONASE) 50 MCG/ACT nasal spray Place 1 spray into both nostrils daily.     lamoTRIgine (LAMICTAL) 200 MG tablet Take 200 mg by mouth daily.     levothyroxine (SYNTHROID) 175 MCG tablet Take 175 mcg by mouth daily before breakfast.     METRONIDAZOLE, TOPICAL, 0.75 % LOTN Apply 1 Application topically at bedtime. 59 mL 11   Multiple Vitamin (MULTI-VITAMIN) tablet Take 1 tablet by mouth daily.     Multiple Vitamins-Minerals (AIRBORNE GUMMIES PO) Take 3 Pieces by mouth daily.     ondansetron (ZOFRAN) 4 MG tablet Take 1 tablet (4 mg total) by mouth every 8 (eight) hours as needed for nausea or vomiting. 20 tablet 0   pantoprazole (PROTONIX) 40 MG tablet Take 40 mg by mouth  daily.     PEPPERMINT OIL PO Take 2 capsules by mouth daily.     Rimegepant Sulfate (NURTEC) 75 MG TBDP Take 75 mg by mouth daily as needed (migraine).     traMADol (ULTRAM) 50 MG tablet Take 1 tablet (50 mg total) by mouth every 6 (six) hours as needed for moderate pain. 20 tablet 0   TURMERIC-GINGER-BLACK PEPPER PO Take 1 capsule by mouth daily.     No current facility-administered medications for this visit.   Allergies: Allergies  Allergen Reactions   Sumatriptan Anaphylaxis    Other reaction(s): Other (See  Comments) Throat swelling and chest tightness   Codeine Nausea And Vomiting   Depakote [Divalproex Sodium] Swelling   Morphine And Related Nausea And Vomiting   Sulfa Antibiotics     Stomach upset: Side Effects   Social History: Social History   Socioeconomic History   Marital status: Married    Spouse name: Not on file   Number of children: Not on file   Years of education: Not on file   Highest education level: Not on file  Occupational History   Not on file  Tobacco Use   Smoking status: Never   Smokeless tobacco: Never  Vaping Use   Vaping Use: Never used  Substance and Sexual Activity   Alcohol use: No   Drug use: No   Sexual activity: Not on file  Other Topics Concern   Not on file  Social History Narrative   Not on file   Social Determinants of Health   Financial Resource Strain: Not on file  Food Insecurity: Not on file  Transportation Needs: Not on file  Physical Activity: Not on file  Stress: Not on file  Social Connections: Not on file   Lives in a ***. Smoking: *** Occupation: ***  Environmental HistoryFreight forwarder in the house: Estate agent in the family room: {Blank single:19197::"yes","no"} Carpet in the bedroom: {Blank single:19197::"yes","no"} Heating: {Blank single:19197::"electric","gas","heat pump"} Cooling: {Blank single:19197::"central","window","heat pump"} Pet: {Blank single:19197::"yes ***","no"}  Family History: Family History  Problem Relation Age of Onset   Arthritis Mother    Hypertension Father        at a young age,    Hyperlipidemia Father    Hypertension Sister    Problem                               Relation Asthma                                   *** Eczema                                *** Food allergy                          *** Allergic rhino conjunctivitis     ***  Review of Systems  Constitutional:  Negative for appetite change, chills, fever and unexpected weight  change.  HENT:  Negative for congestion and rhinorrhea.   Eyes:  Negative for itching.  Respiratory:  Negative for cough, chest tightness, shortness of breath and wheezing.   Cardiovascular:  Negative for chest pain.  Gastrointestinal:  Negative for abdominal pain.  Genitourinary:  Negative for difficulty urinating.  Skin:  Negative for  rash.  Neurological:  Negative for headaches.    Objective: There were no vitals taken for this visit. There is no height or weight on file to calculate BMI. Physical Exam Vitals and nursing note reviewed.  Constitutional:      Appearance: Normal appearance. She is well-developed.  HENT:     Head: Normocephalic and atraumatic.     Right Ear: Tympanic membrane and external ear normal.     Left Ear: Tympanic membrane and external ear normal.     Nose: Nose normal.     Mouth/Throat:     Mouth: Mucous membranes are moist.     Pharynx: Oropharynx is clear.  Eyes:     Conjunctiva/sclera: Conjunctivae normal.  Cardiovascular:     Rate and Rhythm: Normal rate and regular rhythm.     Heart sounds: Normal heart sounds. No murmur heard.    No friction rub. No gallop.  Pulmonary:     Effort: Pulmonary effort is normal.     Breath sounds: Normal breath sounds. No wheezing, rhonchi or rales.  Musculoskeletal:     Cervical back: Neck supple.  Skin:    General: Skin is warm.     Findings: No rash.  Neurological:     Mental Status: She is alert and oriented to person, place, and time.  Psychiatric:        Behavior: Behavior normal.    The plan was reviewed with the patient/family, and all questions/concerned were addressed.  It was my pleasure to see Patricia Adkins today and participate in her care. Please feel free to contact me with any questions or concerns.  Sincerely,  Rexene Alberts, DO Allergy & Immunology  [facility] office: [place] office: [phone]

## 2022-07-26 ENCOUNTER — Encounter: Payer: Self-pay | Admitting: Allergy

## 2022-07-26 ENCOUNTER — Ambulatory Visit (INDEPENDENT_AMBULATORY_CARE_PROVIDER_SITE_OTHER): Payer: BC Managed Care – PPO | Admitting: Allergy

## 2022-07-26 VITALS — BP 110/76 | HR 75 | Temp 97.6°F | Ht 70.5 in | Wt 181.5 lb

## 2022-07-26 DIAGNOSIS — R519 Headache, unspecified: Secondary | ICD-10-CM

## 2022-07-26 DIAGNOSIS — K9049 Malabsorption due to intolerance, not elsewhere classified: Secondary | ICD-10-CM

## 2022-07-26 DIAGNOSIS — Z713 Dietary counseling and surveillance: Secondary | ICD-10-CM

## 2022-07-26 DIAGNOSIS — R198 Other specified symptoms and signs involving the digestive system and abdomen: Secondary | ICD-10-CM | POA: Diagnosis not present

## 2022-07-26 DIAGNOSIS — M7602 Gluteal tendinitis, left hip: Secondary | ICD-10-CM | POA: Diagnosis not present

## 2022-07-26 DIAGNOSIS — J310 Chronic rhinitis: Secondary | ICD-10-CM

## 2022-07-26 DIAGNOSIS — S76012S Strain of muscle, fascia and tendon of left hip, sequela: Secondary | ICD-10-CM | POA: Diagnosis not present

## 2022-07-26 DIAGNOSIS — Z4789 Encounter for other orthopedic aftercare: Secondary | ICD-10-CM | POA: Diagnosis not present

## 2022-07-26 NOTE — Patient Instructions (Addendum)
Today's skin testing showed: Negative to indoor/outdoor allergens and food panel.   Results given.  Food/GI issues Discussed with patient that skin prick testing and bloodwork (food IgE levels) check for IgE mediated reactions which her clinical presentation does not support.  Keep a food journal with symptoms and foods eaten. Continue to avoid foods that are bothersome - dairy, gluten beef. Continue medications as prescribed by your GI doctor. Handout given for FODMAP diet.  Rhinitis:  We only did the prick testing and did not do intradermal testing (with needles) today. If your environmental allergy symptoms worsen then we can do this in the future.  Use over the counter antihistamines such as Zyrtec (cetirizine), Claritin (loratadine), Allegra (fexofenadine), or Xyzal (levocetirizine) daily as needed. May take twice a day during allergy flares. May switch antihistamines every few months. Use Flonase (fluticasone) nasal spray 1 spray per nostril twice a day as needed for nasal congestion. Nasal saline spray (i.e., Simply Saline) or nasal saline lavage (i.e., NeilMed) is recommended as needed and prior to medicated nasal sprays.  Headaches Continue to follow up with neurology. See handout on headache triggers.   Follow up if needed.

## 2022-07-27 ENCOUNTER — Encounter: Payer: Self-pay | Admitting: Allergy

## 2022-07-27 DIAGNOSIS — R198 Other specified symptoms and signs involving the digestive system and abdomen: Secondary | ICD-10-CM | POA: Insufficient documentation

## 2022-07-27 DIAGNOSIS — J310 Chronic rhinitis: Secondary | ICD-10-CM | POA: Insufficient documentation

## 2022-07-27 NOTE — Assessment & Plan Note (Signed)
Rhinitis symptoms with headaches for many years. 2009 skin testing was positive to dust mites, trees and dog per patient. No prior AIT.   Today's skin prick testing showed: Negative to indoor/outdoor allergens.  Declined intradermal testing as symptoms are manageable.  . Use over the counter antihistamines such as Zyrtec (cetirizine), Claritin (loratadine), Allegra (fexofenadine), or Xyzal (levocetirizine) daily as needed. May take twice a day during allergy flares. May switch antihistamines every few months. . Use Flonase (fluticasone) nasal spray 1 spray per nostril twice a day as needed for nasal congestion. . Nasal saline spray (i.e., Simply Saline) or nasal saline lavage (i.e., NeilMed) is recommended as needed and prior to medicated nasal sprays.

## 2022-07-27 NOTE — Assessment & Plan Note (Signed)
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 Triggers include: beef, dairy, gluten, tomato, apples, bananas however has symptoms when not eating above foods. Concerned about other triggers. . Discussed with patient that skin prick testing and bloodwork (food IgE levels) check for IgE mediated reactions which her clinical presentation does not support.  . She would still like to go ahead with testing today.  Today's skin testing showed: Negative to food panel.  Marland Kitchen Keep a food journal with symptoms and foods eaten. . Continue to avoid foods that are bothersome - dairy, gluten beef. . Continue medications as prescribed by GI.  Marland Kitchen Handout given for FODMAP diet.

## 2022-07-29 DIAGNOSIS — Z4789 Encounter for other orthopedic aftercare: Secondary | ICD-10-CM | POA: Diagnosis not present

## 2022-07-29 DIAGNOSIS — S76012S Strain of muscle, fascia and tendon of left hip, sequela: Secondary | ICD-10-CM | POA: Diagnosis not present

## 2022-07-29 DIAGNOSIS — M7602 Gluteal tendinitis, left hip: Secondary | ICD-10-CM | POA: Diagnosis not present

## 2022-08-02 DIAGNOSIS — Z4789 Encounter for other orthopedic aftercare: Secondary | ICD-10-CM | POA: Diagnosis not present

## 2022-08-02 DIAGNOSIS — S76012S Strain of muscle, fascia and tendon of left hip, sequela: Secondary | ICD-10-CM | POA: Diagnosis not present

## 2022-08-02 DIAGNOSIS — M7602 Gluteal tendinitis, left hip: Secondary | ICD-10-CM | POA: Diagnosis not present

## 2022-08-03 ENCOUNTER — Encounter (HOSPITAL_COMMUNITY): Payer: Self-pay | Admitting: Orthopedic Surgery

## 2022-08-05 DIAGNOSIS — M7602 Gluteal tendinitis, left hip: Secondary | ICD-10-CM | POA: Diagnosis not present

## 2022-08-05 DIAGNOSIS — S76012S Strain of muscle, fascia and tendon of left hip, sequela: Secondary | ICD-10-CM | POA: Diagnosis not present

## 2022-08-05 DIAGNOSIS — Z4789 Encounter for other orthopedic aftercare: Secondary | ICD-10-CM | POA: Diagnosis not present

## 2022-08-08 DIAGNOSIS — Z4789 Encounter for other orthopedic aftercare: Secondary | ICD-10-CM | POA: Diagnosis not present

## 2022-08-08 DIAGNOSIS — M7602 Gluteal tendinitis, left hip: Secondary | ICD-10-CM | POA: Diagnosis not present

## 2022-08-08 DIAGNOSIS — S76012S Strain of muscle, fascia and tendon of left hip, sequela: Secondary | ICD-10-CM | POA: Diagnosis not present

## 2022-08-10 DIAGNOSIS — M7602 Gluteal tendinitis, left hip: Secondary | ICD-10-CM | POA: Diagnosis not present

## 2022-08-10 DIAGNOSIS — S76012S Strain of muscle, fascia and tendon of left hip, sequela: Secondary | ICD-10-CM | POA: Diagnosis not present

## 2022-08-10 DIAGNOSIS — Z4789 Encounter for other orthopedic aftercare: Secondary | ICD-10-CM | POA: Diagnosis not present

## 2022-08-11 ENCOUNTER — Encounter (HOSPITAL_COMMUNITY): Payer: Self-pay | Admitting: Orthopedic Surgery

## 2022-08-15 DIAGNOSIS — S76012S Strain of muscle, fascia and tendon of left hip, sequela: Secondary | ICD-10-CM | POA: Diagnosis not present

## 2022-08-15 DIAGNOSIS — Z4789 Encounter for other orthopedic aftercare: Secondary | ICD-10-CM | POA: Diagnosis not present

## 2022-08-15 DIAGNOSIS — M7602 Gluteal tendinitis, left hip: Secondary | ICD-10-CM | POA: Diagnosis not present

## 2022-08-16 ENCOUNTER — Encounter (HOSPITAL_COMMUNITY): Payer: Self-pay | Admitting: Orthopedic Surgery

## 2022-08-16 DIAGNOSIS — T8141XA Infection following a procedure, superficial incisional surgical site, initial encounter: Secondary | ICD-10-CM | POA: Diagnosis not present

## 2022-08-18 ENCOUNTER — Encounter (HOSPITAL_COMMUNITY): Payer: Self-pay | Admitting: Orthopedic Surgery

## 2022-08-22 ENCOUNTER — Encounter: Payer: Self-pay | Admitting: Neurology
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
## 2022-08-23 ENCOUNTER — Ambulatory Visit: Payer: BC Managed Care – PPO | Admitting: Psychiatry

## 2022-08-23 ENCOUNTER — Encounter: Payer: Self-pay | Admitting: Psychiatry

## 2022-08-23 ENCOUNTER — Telehealth: Payer: Self-pay | Admitting: Psychiatry

## 2022-08-23 VITALS — BP 113/71 | HR 80 | Ht 69.5 in | Wt 181.0 lb

## 2022-08-23 DIAGNOSIS — R413 Other amnesia: Secondary | ICD-10-CM

## 2022-08-23 DIAGNOSIS — E039 Hypothyroidism, unspecified: Secondary | ICD-10-CM | POA: Diagnosis not present

## 2022-08-23 NOTE — Telephone Encounter (Signed)
Referral sent to [facility], phone # [phone].

## 2022-08-23 NOTE — Patient Instructions (Signed)
Blood work to check B12 level today Referral to neuropsychology for more extensive memory testing  Tasks to improve attention/working memory 1. Good sleep hygiene (7-8 hrs of sleep) 2. Learning a new skill (Painting, Carpentry, Pottery, new language, Knitting). 3.Cognitive exercises (keep a daily journal, Puzzles) 4. Physical exercise and training  (30 min/day X 4 days week) 5. Being on Antidepressant if needed 6.Yoga, Meditation, Tai Chi 7. Decrease alcohol intake 8.Have a clear schedule and structure in daily routine  MIND Diet: The [place] Diet Intervention for Neurodegenerative Delay, or MIND diet, targets the health of the aging brain. Research participants with the highest MIND diet scores had a significantly slower rate of cognitive decline compared with those with the lowest scores. The effects of the MIND diet on cognition showed greater effects than either the Mediterranean or the DASH diet alone.  The healthy items the MIND diet guidelines suggest include:  3+ servings a day of whole grains 1+ servings a day of vegetables (other than green leafy) 6+ servings a week of green leafy vegetables 5+ servings a week of nuts 4+ meals a week of beans 2+ servings a week of berries 2+ meals a week of poultry 1+ meals a week of fish Mainly olive oil if added fat is used  The unhealthy items, which are higher in saturated and trans fat, include: Less than 5 servings a week of pastries and sweets Less than 4 servings a week of red meat (including beef, pork, lamb, and products made from these meats) Less than one serving a week of cheese and fried foods Less than 1 tablespoon a day of butter/stick margarine

## 2022-08-24 DIAGNOSIS — Z4789 Encounter for other orthopedic aftercare: Secondary | ICD-10-CM | POA: Diagnosis not present

## 2022-08-24 DIAGNOSIS — S76012S Strain of muscle, fascia and tendon of left hip, sequela: Secondary | ICD-10-CM | POA: Diagnosis not present

## 2022-08-24 DIAGNOSIS — M7602 Gluteal tendinitis, left hip: Secondary | ICD-10-CM | POA: Diagnosis not present

## 2022-08-24 LAB — VITAMIN B12: Vitamin B-12: 503 pg/mL (ref 232–1245)

## 2022-08-26 DIAGNOSIS — S76012S Strain of muscle, fascia and tendon of left hip, sequela: Secondary | ICD-10-CM | POA: Diagnosis not present

## 2022-08-26 DIAGNOSIS — Z4789 Encounter for other orthopedic aftercare: Secondary | ICD-10-CM | POA: Diagnosis not present

## 2022-08-26 DIAGNOSIS — M7602 Gluteal tendinitis, left hip: Secondary | ICD-10-CM | POA: Diagnosis not present

## 2022-08-30 DIAGNOSIS — M7602 Gluteal tendinitis, left hip: Secondary | ICD-10-CM | POA: Diagnosis not present

## 2022-08-30 DIAGNOSIS — S76012S Strain of muscle, fascia and tendon of left hip, sequela: Secondary | ICD-10-CM | POA: Diagnosis not present

## 2022-08-30 DIAGNOSIS — Z4789 Encounter for other orthopedic aftercare: Secondary | ICD-10-CM | POA: Diagnosis not present

## 2022-08-31 ENCOUNTER — Encounter (HOSPITAL_COMMUNITY): Payer: Self-pay | Admitting: Orthopedic Surgery

## 2022-08-31 ENCOUNTER — Ambulatory Visit (HOSPITAL_COMMUNITY)
Admission: RE | Admit: 2022-08-31 | Discharge: 2022-08-31 | Disposition: A | Discharge status: Home / Self Care | Payer: BC Managed Care – PPO | Source: Other Acute Inpatient Hospital | Attending: Orthopedic Surgery | Admitting: Orthopedic Surgery

## 2022-08-31 ENCOUNTER — Other Ambulatory Visit: Payer: Self-pay

## 2022-08-31 ENCOUNTER — Ambulatory Visit (HOSPITAL_COMMUNITY): Payer: BC Managed Care – PPO | Admitting: Certified Registered"

## 2022-08-31 ENCOUNTER — Encounter (HOSPITAL_COMMUNITY): Admission: RE | Disposition: A | Discharge status: Home / Self Care | Payer: Self-pay | Attending: Orthopedic Surgery

## 2022-08-31 DIAGNOSIS — T8131XA Disruption of external operation (surgical) wound, not elsewhere classified, initial encounter: Secondary | ICD-10-CM | POA: Diagnosis not present

## 2022-08-31 DIAGNOSIS — T8132XD Disruption of internal operation (surgical) wound, not elsewhere classified, subsequent encounter: Secondary | ICD-10-CM | POA: Diagnosis not present

## 2022-08-31 DIAGNOSIS — Z4789 Encounter for other orthopedic aftercare: Secondary | ICD-10-CM | POA: Diagnosis not present

## 2022-08-31 DIAGNOSIS — X58XXXA Exposure to other specified factors, initial encounter: Secondary | ICD-10-CM | POA: Diagnosis not present

## 2022-08-31 HISTORY — PX: INCISION AND DRAINAGE OF WOUND: SHX1803

## 2022-08-31 LAB — CBC
HCT: 41 % (ref 36.0–46.0)
Hemoglobin: 13.6 g/dL (ref 12.0–15.0)
MCH: 29.4 pg (ref 26.0–34.0)
MCHC: 33.2 g/dL (ref 30.0–36.0)
MCV: 88.6 fL (ref 80.0–100.0)
Platelets: 231 10*3/uL (ref 150–400)
RBC: 4.63 MIL/uL (ref 3.87–5.11)
RDW: 12.9 % (ref 11.5–15.5)
WBC: 6 10*3/uL (ref 4.0–10.5)
nRBC: 0 % (ref 0.0–0.2)

## 2022-08-31 SURGERY — IRRIGATION AND DEBRIDEMENT WOUND
Anesthesia: General | Laterality: Right

## 2022-08-31 MED ORDER — BUPIVACAINE-EPINEPHRINE (PF) 0.25% -1:200000 IJ SOLN
INTRAMUSCULAR | Status: AC
  Filled 2022-08-31: qty 30

## 2022-08-31 MED ORDER — MEPERIDINE HCL 25 MG/ML IJ SOLN
INTRAMUSCULAR | Status: AC
  Filled 2022-08-31: qty 1

## 2022-08-31 MED ORDER — FENTANYL CITRATE (PF) 100 MCG/2ML IJ SOLN: 25.0000 ug | INTRAMUSCULAR | Status: DC | PRN

## 2022-08-31 MED ORDER — CHLORHEXIDINE GLUCONATE 0.12 % MT SOLN
15.0000 mL | Freq: Once | OROMUCOSAL | Status: AC
  Administered 2022-08-31: 15 mL via OROMUCOSAL
  Filled 2022-08-31: qty 15

## 2022-08-31 MED ORDER — ONDANSETRON HCL 4 MG/2ML IJ SOLN
INTRAMUSCULAR | Status: DC | PRN
  Administered 2022-08-31: 4 mg via INTRAVENOUS

## 2022-08-31 MED ORDER — ROCURONIUM BROMIDE 100 MG/10ML IV SOLN
INTRAVENOUS | Status: DC | PRN
  Administered 2022-08-31: 50 mg via INTRAVENOUS

## 2022-08-31 MED ORDER — SODIUM CHLORIDE 0.9 % IR SOLN
Status: DC | PRN
  Administered 2022-08-31: 3000 mL

## 2022-08-31 MED ORDER — MIDAZOLAM HCL 2 MG/2ML IJ SOLN
INTRAMUSCULAR | Status: AC
  Filled 2022-08-31: qty 2

## 2022-08-31 MED ORDER — ONDANSETRON HCL 4 MG/2ML IJ SOLN: 4.0000 mg | Freq: Four times a day (QID) | INTRAMUSCULAR | Status: DC | PRN

## 2022-08-31 MED ORDER — DEXAMETHASONE SODIUM PHOSPHATE 10 MG/ML IJ SOLN
INTRAMUSCULAR | Status: DC | PRN
  Administered 2022-08-31: 10 mg via INTRAVENOUS

## 2022-08-31 MED ORDER — HYDROCODONE-ACETAMINOPHEN 5-325 MG PO TABS: 1.0000 | ORAL_TABLET | Freq: Four times a day (QID) | ORAL | 0 refills | Status: DC | PRN

## 2022-08-31 MED ORDER — PROPOFOL 10 MG/ML IV BOLUS
INTRAVENOUS | Status: DC | PRN
  Administered 2022-08-31: 140 mg via INTRAVENOUS

## 2022-08-31 MED ORDER — PROPOFOL 10 MG/ML IV BOLUS
INTRAVENOUS | Status: AC
  Filled 2022-08-31: qty 20

## 2022-08-31 MED ORDER — OXYCODONE HCL 5 MG PO TABS
ORAL_TABLET | ORAL | Status: AC
  Filled 2022-08-31: qty 1

## 2022-08-31 MED ORDER — BUPIVACAINE-EPINEPHRINE (PF) 0.25% -1:200000 IJ SOLN
INTRAMUSCULAR | Status: DC | PRN
  Administered 2022-08-31: 13 mL via PERINEURAL

## 2022-08-31 MED ORDER — SCOPOLAMINE 1 MG/3DAYS TD PT72
MEDICATED_PATCH | TRANSDERMAL | Status: AC
  Administered 2022-08-31: 1.5 mg via TRANSDERMAL
  Filled 2022-08-31: qty 1

## 2022-08-31 MED ORDER — SUGAMMADEX SODIUM 200 MG/2ML IV SOLN
INTRAVENOUS | Status: DC | PRN
  Administered 2022-08-31: 200 mg via INTRAVENOUS

## 2022-08-31 MED ORDER — FENTANYL CITRATE (PF) 250 MCG/5ML IJ SOLN
INTRAMUSCULAR | Status: AC
  Filled 2022-08-31: qty 5

## 2022-08-31 MED ORDER — CEFAZOLIN SODIUM-DEXTROSE 2-4 GM/100ML-% IV SOLN
2.0000 g | INTRAVENOUS | Status: AC
  Administered 2022-08-31: 2 g via INTRAVENOUS
  Filled 2022-08-31: qty 100

## 2022-08-31 MED ORDER — ORAL CARE MOUTH RINSE: 15.0000 mL | Freq: Once | OROMUCOSAL | Status: AC

## 2022-08-31 MED ORDER — LIDOCAINE HCL (CARDIAC) PF 100 MG/5ML IV SOSY
PREFILLED_SYRINGE | INTRAVENOUS | Status: DC | PRN
  Administered 2022-08-31: 60 mg via INTRATRACHEAL

## 2022-08-31 MED ORDER — ONDANSETRON HCL 4 MG PO TABS: 4.0000 mg | ORAL_TABLET | Freq: Three times a day (TID) | ORAL | 0 refills | Status: AC | PRN

## 2022-08-31 MED ORDER — MEPERIDINE HCL 25 MG/ML IJ SOLN
25.0000 mg | Freq: Once | INTRAMUSCULAR | Status: AC
  Administered 2022-08-31: 25 mg via INTRAVENOUS

## 2022-08-31 MED ORDER — PROPOFOL 500 MG/50ML IV EMUL
INTRAVENOUS | Status: DC | PRN
  Administered 2022-08-31: 175 ug/kg/min via INTRAVENOUS

## 2022-08-31 MED ORDER — OXYCODONE HCL 5 MG/5ML PO SOLN: 5.0000 mg | Freq: Once | ORAL | Status: AC | PRN

## 2022-08-31 MED ORDER — LACTATED RINGERS IV SOLN: INTRAVENOUS | Status: DC

## 2022-08-31 MED ORDER — OXYCODONE HCL 5 MG PO TABS
5.0000 mg | ORAL_TABLET | Freq: Once | ORAL | Status: AC | PRN
  Administered 2022-08-31: 5 mg via ORAL

## 2022-08-31 MED ORDER — MIDAZOLAM HCL 5 MG/5ML IJ SOLN
INTRAMUSCULAR | Status: DC | PRN
  Administered 2022-08-31: 2 mg via INTRAVENOUS

## 2022-08-31 MED ORDER — FENTANYL CITRATE (PF) 250 MCG/5ML IJ SOLN
INTRAMUSCULAR | Status: DC | PRN
  Administered 2022-08-31: 100 ug via INTRAVENOUS
  Administered 2022-08-31: 25 ug via INTRAVENOUS
  Administered 2022-08-31: 50 ug via INTRAVENOUS

## 2022-08-31 MED ORDER — SCOPOLAMINE 1 MG/3DAYS TD PT72: 1.0000 | MEDICATED_PATCH | Freq: Once | TRANSDERMAL | Status: DC

## 2022-08-31 SURGICAL SUPPLY — 46 items
ALCOHOL 70% 16 OZ (MISCELLANEOUS) ×1 IMPLANT
BAG COUNTER SPONGE SURGICOUNT (BAG) ×1 IMPLANT
BAG SPNG CNTER NS LX DISP (BAG) ×1
BLADE SURG 10 STRL SS (BLADE) ×1 IMPLANT
BNDG COHESIVE 4X5 TAN STRL (GAUZE/BANDAGES/DRESSINGS) ×1 IMPLANT
BNDG COHESIVE 6X5 TAN STRL LF (GAUZE/BANDAGES/DRESSINGS) ×2 IMPLANT
BNDG GAUZE DERMACEA FLUFF 4 (GAUZE/BANDAGES/DRESSINGS) ×3 IMPLANT
BNDG GZE DERMACEA 4 6PLY (GAUZE/BANDAGES/DRESSINGS) ×3
COVER SURGICAL LIGHT HANDLE (MISCELLANEOUS) ×1 IMPLANT
CUFF TOURN SGL QUICK 34 (TOURNIQUET CUFF) ×2
CUFF TRNQT CYL 34X4.125X (TOURNIQUET CUFF) ×2 IMPLANT
DRAPE INCISE IOBAN 66X45 STRL (DRAPES) ×4 IMPLANT
DRAPE SURG 17X23 STRL (DRAPES) IMPLANT
DRAPE U-SHAPE 47X51 STRL (DRAPES) ×1 IMPLANT
DRSG AQUACEL AG ADV 3.5X 6 (GAUZE/BANDAGES/DRESSINGS) IMPLANT
DRSG EMULSION OIL 3X3 NADH (GAUZE/BANDAGES/DRESSINGS) IMPLANT
DURAPREP 26ML APPLICATOR (WOUND CARE) ×1 IMPLANT
ELECT CAUTERY BLADE 6.4 (BLADE) ×1 IMPLANT
GAUZE PAD ABD 8X10 STRL (GAUZE/BANDAGES/DRESSINGS) ×1 IMPLANT
GAUZE SPONGE 4X4 12PLY STRL (GAUZE/BANDAGES/DRESSINGS) ×2 IMPLANT
GAUZE XEROFORM 1X8 LF (GAUZE/BANDAGES/DRESSINGS) ×1 IMPLANT
GAUZE XEROFORM 5X9 LF (GAUZE/BANDAGES/DRESSINGS) ×1 IMPLANT
GLOVE BIO SURGEON STRL SZ7.5 (GLOVE) ×2 IMPLANT
GLOVE BIOGEL PI IND STRL 8 (GLOVE) ×2 IMPLANT
GOWN STRL REUS W/ TWL LRG LVL3 (GOWN DISPOSABLE) ×2 IMPLANT
GOWN STRL REUS W/ TWL XL LVL3 (GOWN DISPOSABLE) ×2 IMPLANT
GOWN STRL REUS W/TWL LRG LVL3 (GOWN DISPOSABLE) ×2
GOWN STRL REUS W/TWL XL LVL3 (GOWN DISPOSABLE) ×2
KIT BASIN OR (CUSTOM PROCEDURE TRAY) ×1 IMPLANT
KIT TURNOVER KIT B (KITS) ×1 IMPLANT
MANIFOLD NEPTUNE II (INSTRUMENTS) ×1 IMPLANT
NS IRRIG 1000ML POUR BTL (IV SOLUTION) ×2 IMPLANT
PACK ORTHO EXTREMITY (CUSTOM PROCEDURE TRAY) ×1 IMPLANT
PAD ARMBOARD 7.5X6 YLW CONV (MISCELLANEOUS) ×2 IMPLANT
PADDING CAST ABS COTTON 4X4 ST (CAST SUPPLIES) ×2 IMPLANT
PADDING CAST COTTON 6X4 STRL (CAST SUPPLIES) ×1 IMPLANT
SET CYSTO W/LG BORE CLAMP LF (SET/KITS/TRAYS/PACK) ×1 IMPLANT
SPONGE T-LAP 18X18 ~~LOC~~+RFID (SPONGE) ×2 IMPLANT
STOCKINETTE IMPERVIOUS 9X36 MD (GAUZE/BANDAGES/DRESSINGS) ×1 IMPLANT
SWAB CULTURE ESWAB REG 1ML (MISCELLANEOUS) IMPLANT
TOWEL GREEN STERILE (TOWEL DISPOSABLE) ×1 IMPLANT
TOWEL GREEN STERILE FF (TOWEL DISPOSABLE) ×1 IMPLANT
TUBE CONNECTING 12X1/4 (SUCTIONS) ×1 IMPLANT
UNDERPAD 30X36 HEAVY ABSORB (UNDERPADS AND DIAPERS) ×2 IMPLANT
WATER STERILE IRR 1000ML POUR (IV SOLUTION) ×1 IMPLANT
YANKAUER SUCT BULB TIP NO VENT (SUCTIONS) ×1 IMPLANT

## 2022-08-31 NOTE — Anesthesia Procedure Notes (Signed)
Procedure Name: Intubation Date/Time: 08/31/2022 6:37 PM  Performed by: Elvin So, CRNAPre-anesthesia Checklist: Patient identified, Emergency Drugs available, Suction available and Patient being monitored Patient Re-evaluated:Patient Re-evaluated prior to induction Oxygen Delivery Method: Circle System Utilized Preoxygenation: Pre-oxygenation with 100% oxygen Induction Type: IV induction Ventilation: Mask ventilation without difficulty Laryngoscope Size: Mac and 3 Grade View: Grade I Tube type: Oral Tube size: 7.0 mm Number of attempts: 1 Airway Equipment and Method: Stylet and Oral airway Placement Confirmation: ETT inserted through vocal cords under direct vision, positive ETCO2 and breath sounds checked- equal and bilateral Secured at: 22 cm Tube secured with: Tape Dental Injury: Teeth and Oropharynx as per pre-operative assessment

## 2022-08-31 NOTE — Transfer of Care (Signed)
Immediate Anesthesia Transfer of Care Note  Patient: Patricia Adkins  Procedure(s) Performed: IRRIGATION AND DEBRIDEMENT HIP WOUND (Right)  Patient Location: PACU  Anesthesia Type:General  Level of Consciousness: awake, alert , and oriented  Airway & Oxygen Therapy: Patient Spontanous Breathing and Patient connected to nasal cannula oxygen  Post-op Assessment: Report given to RN, Post -op Vital signs reviewed and stable, and Patient moving all extremities  Post vital signs: Reviewed and stable  Last Vitals:  Vitals Value Taken Time  BP 105/94 08/31/22 1945  Temp    Pulse 66 08/31/22 1949  Resp 24 08/31/22 1949  SpO2 100 % 08/31/22 1949  Vitals shown include unvalidated device data.  Last Pain:  Vitals:   08/31/22 1428  PainSc: 2          Complications: No notable events documented.

## 2022-08-31 NOTE — Brief Op Note (Signed)
08/31/2022  7:17 PM  PATIENT:  Patricia Adkins  51 y.o. female  PRE-OPERATIVE DIAGNOSIS:  Right hip wound  POST-OPERATIVE DIAGNOSIS:  Right hip wound  PROCEDURE:  Procedure(s) with comments: Excisional IRRIGATION AND DEBRIDEMENT HIP WOUND, skin and subcutaneous tissue (Right) -  SURGEON:  Surgeon(s) and Role:    * Nicholes Stairs, MD - Primary  PHYSICIAN ASSISTANT: Jonelle Sidle, PA-C  ANESTHESIA:   local and general  EBL:  10 cc  BLOOD ADMINISTERED:none  DRAINS: none   LOCAL MEDICATIONS USED:  MARCAINE     SPECIMEN:  No Specimen  DISPOSITION OF SPECIMEN:  N/A  COUNTS:  YES  TOURNIQUET:  * No tourniquets in log *  DICTATION: .Note written in EPIC  PLAN OF CARE: Discharge to home after PACU  PATIENT DISPOSITION:  PACU - hemodynamically stable.   Delay start of Pharmacological VTE agent (>24hrs) due to surgical blood loss or risk of bleeding: not applicable

## 2022-08-31 NOTE — H&P (Signed)
ORTHOPAEDIC H&P   REQUESTING PHYSICIAN: Nicholes Stairs, MD  PCP:  Aura Dials, MD  Chief Complaint: Right hip wound with central sinus  HPI: Patricia Adkins is a 51 y.o. female who complains of right hip wound with a sinus tract following open gluteus tendon repair about 3 months ago.  She had been doing just fine and then about 3 weeks ago noted a small central opening in the incision.  She did go on a vacation and then when she returned noted some clear drainage.  We attempted to pack this and manage with oral antibiotics but continue with persistent drainage and now seems to establish more of a sinus into the subcutaneous fat.  She is here today for open debridement and closure.  Past Medical History:  Diagnosis Date   Anemia    Anxiety    Atypical mole 08/03/2006   Left Abdomen (marked) (excision)   Atypical mole 08/03/2006   Lower Back (marked) (excision)   Atypical mole 05/23/2012   Left Low Back (moderate) (wider shave)   Atypical mole 06/28/2012   Left Paraspinal (moderate) (wider shave)   Atypical mole 06/28/2012   Right Lateral Back (mild)   Atypical mole 06/28/2012   Right Upper Buttock (severe) (wider shave)   Atypical mole 06/28/2012   Left Upper Arm (severe) (wider shave)   Atypical mole 07/19/2013   Left Post Shoulder Superior (moderate)   Atypical mole 07/19/2013   Left Post Shoulder Inferior (mild)   Atypical mole 07/19/2013   Right Migh Thigh (moderate)   Atypical mole 05/30/2014   Left Chin (mild)   Atypical mole 05/30/2014   Lower Abdomen (moderate)   Atypical mole 05/30/2014   Left Tricept (unsual spitz tumor) (wider shave)   Atypical mole 10/27/2015   Left Thigh (moderate)   Atypical mole 10/27/2015   Supra Pubic (moderate)   Atypical mole 06/07/2016   Right Post Shoulder (moderate)   Atypical mole 11/06/2017   Right Upper Abdomen (moderate)   Atypical mole 08/01/2018   Left Thigh (moderate) (wider shave)   Atypical mole 08/01/2018    Right Forearm (severe) (excision)   Atypical mole 06/26/2019   Right Outer Mid Back (severe) (wider shave)   Bipolar 1 disorder ([facility])    (Dr. Toy Care)- Started after second pregnancy   Depression    Gastritis    Hypothyroidism    Lumbar degenerative disc disease    Migraine    headaches (Dr. Catalina Gravel)   Pneumonia    PONV (postoperative nausea and vomiting)    Thyroid disease    Past Surgical History:  Procedure Laterality Date   ABDOMINAL HYSTERECTOMY     CESAREAN SECTION  10/10/1996   CYSTECTOMY     Uterine   DILATION AND CURETTAGE OF UTERUS     OPEN SURGICAL REPAIR OF GLUTEAL TENDON Right 06/17/2022   Procedure: RIGHT HIP OPEN REPAIR OF GLUTEAL MEDIUS;  Surgeon: Nicholes Stairs, MD;  Location: [facility];  Service: Orthopedics;  Laterality: Right;  120   PLANTAR FASCIA SURGERY Right    US ECHOCARDIOGRAPHY     Normal EF, mild MR/TR, Normal LV size and function. There were no regional wall motion abnormailities. Left Ventricular ejection fraction estimated by 2D at 60-65%. Mild mitral valve regurgitation. Mild tricuspid regurgitation   Social History   Socioeconomic History   Marital status: Married    Spouse name: Not on file   Number of children: Not on file   Years of education: Not on file  Highest education level: Not on file  Occupational History   Not on file  Tobacco Use   Smoking status: Never   Smokeless tobacco: Never  Vaping Use   Vaping Use: Never used  Substance and Sexual Activity   Alcohol use: No   Drug use: No   Sexual activity: Not on file  Other Topics Concern   Not on file  Social History Narrative   Not on file   Social Determinants of Health   Financial Resource Strain: Not on file  Food Insecurity: Not on file  Transportation Needs: Not on file  Physical Activity: Not on file  Stress: Not on file  Social Connections: Not on file   Family History  Problem Relation Age of Onset   Allergic rhinitis Mother    Arthritis Mother     Hypertension Father        at a young age,    Hyperlipidemia Father    Allergic rhinitis Sister    Hypertension Sister    Asthma Son    Allergies  Allergen Reactions   Imitrex [Sumatriptan] Anaphylaxis, Swelling and Other (See Comments)    Throat swelling Chest tightness   Codeine Nausea And Vomiting   Entocort Ec [Budesonide] Swelling and Other (See Comments)    Swelling and redness to face   Morphine And Related Nausea And Vomiting   Nsaids Other (See Comments)    Told to avoid NSAIDs due to kidney function   Sulfa Antibiotics Other (See Comments)    Stomach upset   Depakote [Divalproex Sodium] Hives and Rash   Prior to Admission medications   Medication Sig Start Date End Date Taking? Authorizing Provider  acetaminophen (TYLENOL) 500 MG tablet Take 1,000 mg by mouth 2 (two) times daily as needed for mild pain, fever or headache.   Yes [provider]  AIMOVIG 140 MG/ML SOAJ Inject 140 mg into the skin every 30 (thirty) days. 5th of each month 04/08/22  Yes [provider]  Boswellia-Glucosamine-Vit D (OSTEO BI-FLEX ONE PER DAY) TABS Take 1 tablet by mouth in the morning.   Yes [provider]  CAPLYTA 21 MG CAPS Take 21 mg by mouth at bedtime. 12/31/21  Yes [provider]  Cholecalciferol (VITAMIN D3) 250 MCG (10000 UT) capsule Take 10,000 Units by mouth in the morning.   Yes [provider]  clonazePAM (KLONOPIN) 1 MG tablet Take 1 mg by mouth 3 (three) times daily.   Yes [provider]  diclofenac Sodium (VOLTAREN) 1 % GEL Apply 1 Application topically 2 (two) times daily as needed (hip pain, knee pain).   Yes [provider]  diphenhydrAMINE (BENADRYL) 25 MG tablet Take 37.5 mg by mouth at bedtime.   Yes [provider]  fluticasone (FLONASE) 50 MCG/ACT nasal spray Place 1 spray into both nostrils in the morning. 08/11/17  Yes [provider]  lamoTRIgine (LAMICTAL) 200 MG tablet Take 200 mg by  mouth at bedtime. 12/20/17  Yes [provider]  METRONIDAZOLE, TOPICAL, 0.75 % LOTN Apply 1 Application topically at bedtime. Patient taking differently: Apply 1 Application topically in the morning. 04/27/22  Yes Lavonna Monarch, MD  Multiple Vitamins-Minerals (MULTIVITAMIN WOMEN 50+) TABS Take 1 tablet by mouth in the morning.   Yes [provider]  Pancrelipase, Lip-Prot-Amyl, (ZENPEP) 40000-126000 units CPEP Take 40,000-80,000 units of lipase by mouth See admin instructions. 80,000 units of lipase with every meal, 40,000 units with every snack   Yes [provider]  PEPPERMINT  OIL PO Take 1 capsule by mouth in the morning.   Yes [provider]  Probiotic Product (ALIGN) 4 MG CAPS Take 4 mg by mouth in the morning.   Yes [provider]  promethazine (PHENERGAN) 25 MG tablet Take 25 mg by mouth daily as needed for nausea or vomiting.   Yes [provider]  Rimegepant Sulfate (NURTEC) 75 MG TBDP Take 75 mg by mouth daily as needed (migraine). 07/20/20  Yes [provider]  saccharomyces boulardii (FLORASTOR) 250 MG capsule Take 250 mg by mouth in the morning.   Yes [provider]  Simethicone (GAS-X PO) Take 1 capsule by mouth in the morning.   Yes [provider]  traMADol (ULTRAM) 50 MG tablet Take 25-50 mg by mouth daily as needed for moderate pain.   Yes [provider]  Turmeric (QC TUMERIC COMPLEX PO) Take 1 tablet by mouth in the morning.   Yes [provider]   No results found.  Positive ROS: All other systems have been reviewed and were otherwise negative with the exception of those mentioned in the HPI and as above.  Physical Exam: General: Alert, no acute distress Cardiovascular: No pedal edema Respiratory: No cyanosis, no use of accessory musculature GI: No organomegaly, abdomen is soft and non-tender Skin: No lesions in the area of chief complaint Neurologic: Sensation intact  distally Psychiatric: Patient is competent for consent with normal mood and affect Lymphatic: No axillary or cervical lymphadenopathy  MUSCULOSKELETAL: Right hip:  She is a central area of about 5 mm wound with no surrounding erythema or warmth or fluctuance.  Clear drainage noted.  Proximal and distal aspect of incision and intact.  Assessment: Right hip wound dehiscence  Right hip wound sinus tract  Plan: Proceed today with open incisional and excisional debridement of skin and subcutaneous fat.  We also will excise the sinus tract and then perform layered closure.  Anticipate able to discharge home postoperatively.  We will make intraoperative assessment as to whether or not she needs antibiotics at discharge.  At this point this looks to be a noninfectious process.  We again discussed the risk of bleeding, infection, damage to surrounding nerves and vessels, stiffness of the hip, need for further surgery, risk of DVT, as well as the risk of anesthesia.  She has provided informed consent.    Nicholes Stairs, MD Cell [phone]    08/31/2022 4:19 PM

## 2022-08-31 NOTE — Op Note (Signed)
Date of Surgery: 08/31/2022  INDICATIONS: Patricia Adkins is a 51 y.o.-year-old female with a right hip dehisced wound with a apparent sinus tract with persistent drainage.  Here today for excisional debridement and closure of the wound.  She is now about 3 months out from an open gluteus medius tendon repair.  She is doing just fine postoperatively.  However, did have some central dehiscence about 2 to 3 weeks ago.  After packing the wound and doing serial dressing changes they were not comfortable with the persistent state of the wound.  We recommended procedures today.;  The patient did consent to the procedure after discussion of the risks and benefits.  PREOPERATIVE DIAGNOSIS:  Right hip surgical wound dehiscence   POSTOPERATIVE DIAGNOSIS:  1.  Right hip surgical wound dehiscence 2.  Right hip wound sinus tract  PROCEDURE:  Excisional irrigation and debridement of skin, subcutaneous tissue for right hip wound  SURGEON: Geralynn Rile, M.D.  ASSIST: Jonelle Sidle, PA-C  Assistant attestation:  PA Thereasa Solo was present for the entire procedure..  ANESTHESIA:  general  IV FLUIDS AND URINE: See anesthesia.  ESTIMATED BLOOD LOSS: 10 mL.  IMPLANTS: none  DRAINS: none  COMPLICATIONS: None.  DESCRIPTION OF PROCEDURE: The patient was brought to the operating room and placed supine on the operating table.  The patient had been signed prior to the procedure and this was documented. The patient had the anesthesia placed by the anesthesiologist.  A time-out was performed to confirm that this was the correct patient, site, side and location.  Once anesthesia was provided by the anesthesia team we then placed her in the left lateral decubitus position with the right hip elevated.  The patient did receive antibiotics prior to the incision and was re-dosed during the procedure as needed at indicated intervals.  A tourniquet was not placed.  The patient had the operative extremity prepped and draped in  the standard surgical fashion.     We began the procedure by probing the central wound.  This wound measured about 4 to 5 mm in diameter.  The proximal and distal aspect of the surgical incision were pristine and completely intact.  We noted that this tunneled in a superficial direction anterior and about the 4 o'clock position on the clock face.  It tunneled about 2-1/2 cm.  This had a blind endpoint.  At this juncture we elected to open the wound distal approximately 5 cm.  This allowed [place] access to the sinus tract.  At this time we debrided sharply with knife skin, subcutaneous tissue, and subcutaneous fat around this area.  We were able to access the end of the sinus tract.  This did not violate the IT fascia which was palpated along the entire length of this incision.  At this juncture we then copiously irrigated the wound with normal saline, 3 L.  Previous to this we did obtain some cultures for microbiology.  Next, we began again probing the subcutaneous fat and found that there were no residual sinus tracts.  We then began closure of the wound.  Of note, she had abundant subcutaneous fat tissue and therefore we elected to perform a double layer 0 Vicryl closure of the fat alone.  This allowed for nice obliteration of any potential dead space.  We then closed the dermal layer with interrupted 2-0 Monocryl.  Lastly 3-0 nylon in interrupted horizontal mattress fashion for the skin.  The leg was cleaned and dried and a standard sterile bandage was applied.  All counts were correct x 2.  She was awakened from general anesthetic in stable condition transported back to the stretcher and then to PACU with no noted complications.  She was in stable condition.  POSTOPERATIVE PLAN:  Simisola can resume her postoperative course from before surgery today with gluteal strengthening exercises and weightbearing as tolerated to the right lower extremity.  She can begin showering with her postoperative bandage in place  starting on postoperative day #1.  She can discharge home today from PACU.  No infectious process observed today so we will hold on antibiotics until cultures are final.  I will see her in the office in 2 weeks.

## 2022-08-31 NOTE — Anesthesia Preprocedure Evaluation (Signed)
Anesthesia Evaluation  Patient identified by MRN, date of birth, ID band Patient awake    Reviewed: Allergy & Precautions, H&P , NPO status , Patient's Chart, lab work & pertinent test results  History of Anesthesia Complications (+) PONV and history of anesthetic complications  Airway Mallampati: II   Neck ROM: full    Dental   Pulmonary neg pulmonary ROS   breath sounds clear to auscultation       Cardiovascular negative cardio ROS  Rhythm:regular Rate:Normal     Neuro/Psych  Headaches PSYCHIATRIC DISORDERS Anxiety Depression Bipolar Disorder      GI/Hepatic   Endo/Other  Hypothyroidism    Renal/GU      Musculoskeletal  (+) Arthritis ,    Abdominal   Peds  Hematology  (+) Blood dyscrasia, anemia   Anesthesia Other Findings   Reproductive/Obstetrics                             Anesthesia Physical Anesthesia Plan  ASA: 2  Anesthesia Plan: General   Post-op Pain Management:    Induction: Intravenous  PONV Risk Score and Plan: 4 or greater and Ondansetron, Dexamethasone, Scopolamine patch - Pre-op, Midazolam and Treatment may vary due to age or medical condition  Airway Management Planned: Oral ETT  Additional Equipment:   Intra-op Plan:   Post-operative Plan: Extubation in OR  Informed Consent: I have reviewed the patients History and Physical, chart, labs and discussed the procedure including the risks, benefits and alternatives for the proposed anesthesia with the patient or authorized representative who has indicated his/her understanding and acceptance.     Dental advisory given  Plan Discussed with: CRNA, Anesthesiologist and Surgeon  Anesthesia Plan Comments:        Anesthesia Quick Evaluation

## 2022-08-31 NOTE — Discharge Instructions (Addendum)
Orthopedic surgery discharge instructions:  Okay for weightbearing as tolerated to the right lower extremity.  No formal restrictions.  Maintain postoperative bandage until her follow-up appointment.  This is waterproof and you may begin showering on postoperative day #1, but do not submerge underwater.  For mild to moderate pain use Tylenol and Advil around-the-clock and also apply ice to the right hip for 20 to 30 minutes out of each hour that you are able to.  For any breakthrough pain use Norco as necessary.  Please take an 81 mg aspirin once per day x 6 weeks for DVT prevention.  Follow-up with Dr. Stann Mainland in the office in 2 weeks.

## 2022-09-01 ENCOUNTER — Encounter (HOSPITAL_COMMUNITY): Payer: Self-pay | Admitting: Orthopedic Surgery

## 2022-09-01 NOTE — Anesthesia Postprocedure Evaluation (Signed)
Anesthesia Post Note  Patient: Patricia Adkins  Procedure(s) Performed: IRRIGATION AND DEBRIDEMENT HIP WOUND (Right)     Patient location during evaluation: PACU Anesthesia Type: General Level of consciousness: awake and alert Pain management: pain level controlled Vital Signs Assessment: post-procedure vital signs reviewed and stable Respiratory status: spontaneous breathing, nonlabored ventilation, respiratory function stable and patient connected to nasal cannula oxygen Cardiovascular status: blood pressure returned to baseline and stable Postop Assessment: no apparent nausea or vomiting Anesthetic complications: no   No notable events documented.  Last Vitals:  Vitals:   08/31/22 2015 08/31/22 2030  BP: 121/71 121/80  Pulse: 61 67  Resp: 20 19  Temp:  37.2 C  SpO2: 100% 100%    Last Pain:  Vitals:   08/31/22 2030  PainSc: 4                  Christobal Morado P Darian Cansler

## 2022-09-05 LAB — AEROBIC/ANAEROBIC CULTURE W GRAM STAIN (SURGICAL/DEEP WOUND)

## 2022-09-06 DIAGNOSIS — Z4789 Encounter for other orthopedic aftercare: Secondary | ICD-10-CM | POA: Diagnosis not present

## 2022-09-06 DIAGNOSIS — Z79890 Hormone replacement therapy (postmenopausal): Secondary | ICD-10-CM | POA: Diagnosis not present

## 2022-09-06 DIAGNOSIS — S76012S Strain of muscle, fascia and tendon of left hip, sequela: Secondary | ICD-10-CM | POA: Diagnosis not present

## 2022-09-06 DIAGNOSIS — M7602 Gluteal tendinitis, left hip: Secondary | ICD-10-CM | POA: Diagnosis not present

## 2022-09-06 DIAGNOSIS — E039 Hypothyroidism, unspecified: Secondary | ICD-10-CM | POA: Diagnosis not present

## 2022-09-06 DIAGNOSIS — E049 Nontoxic goiter, unspecified: Secondary | ICD-10-CM | POA: Diagnosis not present

## 2022-09-08 DIAGNOSIS — M7602 Gluteal tendinitis, left hip: Secondary | ICD-10-CM | POA: Diagnosis not present

## 2022-09-08 DIAGNOSIS — Z4789 Encounter for other orthopedic aftercare: Secondary | ICD-10-CM | POA: Diagnosis not present

## 2022-09-08 DIAGNOSIS — S76012S Strain of muscle, fascia and tendon of left hip, sequela: Secondary | ICD-10-CM | POA: Diagnosis not present

## 2022-09-08 NOTE — Telephone Encounter (Signed)
Referral sent to Dr. Otto Herb office, phone # [phone].

## 2022-09-08 NOTE — Telephone Encounter (Signed)
Spoke with [facility]. They advised that they can not accept this referral as they do not do neuropsych testing at their office.

## 2022-09-13 DIAGNOSIS — F5101 Primary insomnia: Secondary | ICD-10-CM | POA: Diagnosis not present

## 2022-09-13 DIAGNOSIS — F410 Panic disorder [episodic paroxysmal anxiety] without agoraphobia: Secondary | ICD-10-CM | POA: Diagnosis not present

## 2022-09-13 DIAGNOSIS — F3132 Bipolar disorder, current episode depressed, moderate: Secondary | ICD-10-CM | POA: Diagnosis not present

## 2022-09-14 DIAGNOSIS — Z4789 Encounter for other orthopedic aftercare: Secondary | ICD-10-CM | POA: Diagnosis not present

## 2022-09-14 DIAGNOSIS — S76012S Strain of muscle, fascia and tendon of left hip, sequela: Secondary | ICD-10-CM | POA: Diagnosis not present

## 2022-09-14 DIAGNOSIS — M7602 Gluteal tendinitis, left hip: Secondary | ICD-10-CM | POA: Diagnosis not present

## 2022-09-16 ENCOUNTER — Other Ambulatory Visit (HOSPITAL_COMMUNITY): Payer: Self-pay | Admitting: Orthopedic Surgery

## 2022-09-16 ENCOUNTER — Ambulatory Visit (HOSPITAL_COMMUNITY)
Admission: RE | Admit: 2022-09-16 | Discharge: 2022-09-16 | Disposition: A | Discharge status: Home / Self Care | Payer: BC Managed Care – PPO | Source: Ambulatory Visit | Attending: Internal Medicine | Admitting: Internal Medicine

## 2022-09-16 DIAGNOSIS — M7989 Other specified soft tissue disorders: Secondary | ICD-10-CM | POA: Insufficient documentation

## 2022-09-18 DIAGNOSIS — R140 Abdominal distension (gaseous): Secondary | ICD-10-CM | POA: Diagnosis not present

## 2022-09-20 DIAGNOSIS — Z4789 Encounter for other orthopedic aftercare: Secondary | ICD-10-CM | POA: Diagnosis not present

## 2022-09-20 DIAGNOSIS — M7602 Gluteal tendinitis, left hip: Secondary | ICD-10-CM | POA: Diagnosis not present

## 2022-09-20 DIAGNOSIS — S76012S Strain of muscle, fascia and tendon of left hip, sequela: Secondary | ICD-10-CM | POA: Diagnosis not present

## 2022-09-21 DIAGNOSIS — N182 Chronic kidney disease, stage 2 (mild): Secondary | ICD-10-CM | POA: Diagnosis not present

## 2022-09-22 DIAGNOSIS — M7602 Gluteal tendinitis, left hip: Secondary | ICD-10-CM | POA: Diagnosis not present

## 2022-09-22 DIAGNOSIS — S76012S Strain of muscle, fascia and tendon of left hip, sequela: Secondary | ICD-10-CM | POA: Diagnosis not present

## 2022-09-22 DIAGNOSIS — G43719 Chronic migraine without aura, intractable, without status migrainosus: Secondary | ICD-10-CM | POA: Diagnosis not present

## 2022-09-22 DIAGNOSIS — Z4789 Encounter for other orthopedic aftercare: Secondary | ICD-10-CM | POA: Diagnosis not present

## 2022-10-05 ENCOUNTER — Ambulatory Visit: Payer: BC Managed Care – PPO | Admitting: Podiatry

## 2022-10-07 ENCOUNTER — Ambulatory Visit (INDEPENDENT_AMBULATORY_CARE_PROVIDER_SITE_OTHER): Payer: BC Managed Care – PPO | Admitting: Orthopedic Surgery

## 2022-10-07 ENCOUNTER — Encounter: Payer: Self-pay | Admitting: Orthopedic Surgery

## 2022-10-07 DIAGNOSIS — T8149XA Infection following a procedure, other surgical site, initial encounter: Secondary | ICD-10-CM

## 2022-10-10 ENCOUNTER — Encounter: Payer: Self-pay | Admitting: Orthopedic Surgery

## 2022-10-10 NOTE — Progress Notes (Signed)
Office Visit Note   Patient: Patricia Adkins           Date of Birth: 21-Mar-1971           MRN: [id number] Visit Date: 10/07/2022              Requested by: Aura Dials, [address] [place],  [place] [address] PCP: Aura Dials, MD  Chief Complaint  Patient presents with   Right Leg - Open Wound    Second opinion      HPI: Patient is a 52 year old woman who is seen for initial evaluation for right hip wound.  Patient is status post open repair of the gluteus muscles right hip on 06/17/2022.  She subsequently had debridement of the hip on 08/31/2022.  Patient had no known injury prior to surgery.  Patient is currently doing wet-to-dry dressing changes has been on clindamycin since December 16 and completes her antibiotics today.  Assessment & Plan: Visit Diagnoses:  1. Postoperative wound infection of right hip     Plan: Will plan for surgical debridement of the right hip wound with removal of retained suture and anchors.  Follow-Up Instructions: Return in about 2 weeks (around 10/21/2022).   Ortho Exam  Patient is alert, oriented, no adenopathy, well-dressed, normal affect, normal respiratory effort. Examination of the right hip patient has a 1 cm ulcer proximally along the incision line and a distal blister 5 mm in diameter distally.  There is undermining to the wound.  No surrounding cellulitis.  Imaging: No results found.   Labs: Lab Results  Component Value Date   REPTSTATUS 09/05/2022 FINAL 08/31/2022   GRAMSTAIN  08/31/2022    RARE WBC PRESENT, PREDOMINANTLY MONONUCLEAR NO ORGANISMS SEEN    CULT  08/31/2022    RARE DIPHTHEROIDS(CORYNEBACTERIUM SPECIES) Standardized susceptibility testing for this organism is not available. NO ANAEROBES ISOLATED Performed at [facility], [place] [address], [place],  [address]      No results found for: "ALBUMIN", "PREALBUMIN", "CBC"  No results found for: "MG" No results found for: "VD25OH"  No  results found for: "PREALBUMIN"    Latest Ref Rng & Units 08/31/2022    2:52 PM 06/08/2022   11:36 AM  CBC EXTENDED  WBC 4.0 - 10.5 K/uL 6.0  4.8   RBC 3.87 - 5.11 MIL/uL 4.63  4.77   Hemoglobin 12.0 - 15.0 g/dL 13.6  13.7   HCT 36.0 - 46.0 % 41.0  42.8   Platelets 150 - 400 K/uL 231  219      There is no height or weight on file to calculate BMI.  Orders:  No orders of the defined types were placed in this encounter.  No orders of the defined types were placed in this encounter.    Procedures: No procedures performed  Clinical Data: No additional findings.  ROS:  All other systems negative, except as noted in the HPI. Review of Systems  Objective: Vital Signs: There were no vitals taken for this visit.  Specialty Comments:  No specialty comments available.  PMFS History: Patient Active Problem List   Diagnosis Date Noted   Gastrointestinal complaints 07/27/2022   Chronic rhinitis 07/27/2022   Past Medical History:  Diagnosis Date   Anemia    Anxiety    Atypical mole 08/03/2006   Left Abdomen (marked) (excision)   Atypical mole 08/03/2006   Lower Back (marked) (excision)   Atypical mole 05/23/2012   Left Low Back (moderate) (  wider shave)   Atypical mole 06/28/2012   Left Paraspinal (moderate) (wider shave)   Atypical mole 06/28/2012   Right Lateral Back (mild)   Atypical mole 06/28/2012   Right Upper Buttock (severe) (wider shave)   Atypical mole 06/28/2012   Left Upper Arm (severe) (wider shave)   Atypical mole 07/19/2013   Left Post Shoulder Superior (moderate)   Atypical mole 07/19/2013   Left Post Shoulder Inferior (mild)   Atypical mole 07/19/2013   Right Migh Thigh (moderate)   Atypical mole 05/30/2014   Left Chin (mild)   Atypical mole 05/30/2014   Lower Abdomen (moderate)   Atypical mole 05/30/2014   Left Tricept (unsual spitz tumor) (wider shave)   Atypical mole 10/27/2015   Left Thigh (moderate)   Atypical mole 10/27/2015   Supra  Pubic (moderate)   Atypical mole 06/07/2016   Right Post Shoulder (moderate)   Atypical mole 11/06/2017   Right Upper Abdomen (moderate)   Atypical mole 08/01/2018   Left Thigh (moderate) (wider shave)   Atypical mole 08/01/2018   Right Forearm (severe) (excision)   Atypical mole 06/26/2019   Right Outer Mid Back (severe) (wider shave)   Bipolar 1 disorder ([facility])    (Dr. Toy Care)- Started after second pregnancy   Depression    Gastritis    Hypothyroidism    Lumbar degenerative disc disease    Migraine    headaches (Dr. Catalina Gravel)   Pneumonia    PONV (postoperative nausea and vomiting)    Thyroid disease     Family History  Problem Relation Age of Onset   Allergic rhinitis Mother    Arthritis Mother    Hypertension Father        at a young age,    Hyperlipidemia Father    Allergic rhinitis Sister    Hypertension Sister    Asthma Son     Past Surgical History:  Procedure Laterality Date   ABDOMINAL HYSTERECTOMY     CESAREAN SECTION  10/10/1996   CYSTECTOMY     Uterine   DILATION AND CURETTAGE OF UTERUS     INCISION AND DRAINAGE OF WOUND Right 08/31/2022   Procedure: IRRIGATION AND DEBRIDEMENT HIP WOUND;  Surgeon: Nicholes Stairs, MD;  Location: [facility];  Service: Orthopedics;  Laterality: Right;  60 ok per OR   OPEN SURGICAL REPAIR OF GLUTEAL TENDON Right 06/17/2022   Procedure: RIGHT HIP OPEN REPAIR OF GLUTEAL MEDIUS;  Surgeon: Nicholes Stairs, MD;  Location: [facility];  Service: Orthopedics;  Laterality: Right;  120   PLANTAR FASCIA SURGERY Right    US ECHOCARDIOGRAPHY     Normal EF, mild MR/TR, Normal LV size and function. There were no regional wall motion abnormailities. Left Ventricular ejection fraction estimated by 2D at 60-65%. Mild mitral valve regurgitation. Mild tricuspid regurgitation   Social History   Occupational History   Not on file  Tobacco Use   Smoking status: Never   Smokeless tobacco: Never  Vaping Use   Vaping Use: Never used  Substance  and Sexual Activity   Alcohol use: No   Drug use: No   Sexual activity: Not on file

## 2022-10-11 ENCOUNTER — Encounter (HOSPITAL_COMMUNITY): Payer: Self-pay | Admitting: Orthopedic Surgery

## 2022-10-11 ENCOUNTER — Other Ambulatory Visit: Payer: Self-pay

## 2022-10-11 NOTE — Progress Notes (Addendum)
Mrs Patricia Adkins denies chest pain or shortness of breath.  Patient denies having any s/s of Covid in her household, also denies any known exposure to Covid.   Mrs Patricia Che' PCP is Dr. Aura Dials.

## 2022-10-11 NOTE — Progress Notes (Signed)
Spoke with the pt, she will arrive tom at 1000. ERAS drink up to 0930.

## 2022-10-12 ENCOUNTER — Ambulatory Visit (HOSPITAL_COMMUNITY): Payer: BC Managed Care – PPO | Admitting: Anesthesiology

## 2022-10-12 ENCOUNTER — Other Ambulatory Visit: Payer: Self-pay | Admitting: Orthopedic Surgery

## 2022-10-12 ENCOUNTER — Telehealth: Payer: Self-pay | Admitting: Orthopedic Surgery

## 2022-10-12 ENCOUNTER — Encounter (HOSPITAL_COMMUNITY)
Admission: RE | Disposition: A | Discharge status: Home / Self Care | Payer: Self-pay | Source: Home / Self Care | Attending: Orthopedic Surgery

## 2022-10-12 ENCOUNTER — Other Ambulatory Visit: Payer: Self-pay

## 2022-10-12 ENCOUNTER — Ambulatory Visit (HOSPITAL_COMMUNITY)
Admission: RE | Admit: 2022-10-12 | Discharge: 2022-10-12 | Disposition: A | Discharge status: Home / Self Care | Payer: BC Managed Care – PPO | Attending: Orthopedic Surgery | Admitting: Orthopedic Surgery

## 2022-10-12 ENCOUNTER — Encounter (HOSPITAL_COMMUNITY): Payer: Self-pay | Admitting: Orthopedic Surgery

## 2022-10-12 DIAGNOSIS — M71151 Other infective bursitis, right hip: Secondary | ICD-10-CM | POA: Diagnosis not present

## 2022-10-12 DIAGNOSIS — Y798 Miscellaneous orthopedic devices associated with adverse incidents, not elsewhere classified: Secondary | ICD-10-CM | POA: Insufficient documentation

## 2022-10-12 DIAGNOSIS — F3181 Bipolar II disorder: Secondary | ICD-10-CM | POA: Insufficient documentation

## 2022-10-12 DIAGNOSIS — T8469XA Infection and inflammatory reaction due to internal fixation device of other site, initial encounter: Secondary | ICD-10-CM | POA: Diagnosis not present

## 2022-10-12 DIAGNOSIS — Y834 Other reconstructive surgery as the cause of abnormal reaction of the patient, or of later complication, without mention of misadventure at the time of the procedure: Secondary | ICD-10-CM | POA: Insufficient documentation

## 2022-10-12 DIAGNOSIS — M1990 Unspecified osteoarthritis, unspecified site: Secondary | ICD-10-CM | POA: Insufficient documentation

## 2022-10-12 DIAGNOSIS — E039 Hypothyroidism, unspecified: Secondary | ICD-10-CM | POA: Diagnosis not present

## 2022-10-12 DIAGNOSIS — S71001A Unspecified open wound, right hip, initial encounter: Secondary | ICD-10-CM | POA: Diagnosis not present

## 2022-10-12 DIAGNOSIS — T8140XA Infection following a procedure, unspecified, initial encounter: Secondary | ICD-10-CM | POA: Diagnosis not present

## 2022-10-12 DIAGNOSIS — F418 Other specified anxiety disorders: Secondary | ICD-10-CM | POA: Diagnosis not present

## 2022-10-12 DIAGNOSIS — J189 Pneumonia, unspecified organism: Secondary | ICD-10-CM | POA: Diagnosis not present

## 2022-10-12 DIAGNOSIS — T8131XA Disruption of external operation (surgical) wound, not elsewhere classified, initial encounter: Secondary | ICD-10-CM | POA: Diagnosis not present

## 2022-10-12 DIAGNOSIS — M71051 Abscess of bursa, right hip: Secondary | ICD-10-CM | POA: Diagnosis not present

## 2022-10-12 HISTORY — DX: Nonrheumatic mitral (valve) prolapse: I34.1

## 2022-10-12 HISTORY — DX: Chronic kidney disease, unspecified: N18.9

## 2022-10-12 HISTORY — PX: I & D EXTREMITY: SHX5045

## 2022-10-12 LAB — CBC
HCT: 39 % (ref 36.0–46.0)
Hemoglobin: 13 g/dL (ref 12.0–15.0)
MCH: 29.6 pg (ref 26.0–34.0)
MCHC: 33.3 g/dL (ref 30.0–36.0)
MCV: 88.8 fL (ref 80.0–100.0)
Platelets: 200 10*3/uL (ref 150–400)
RBC: 4.39 MIL/uL (ref 3.87–5.11)
RDW: 12.3 % (ref 11.5–15.5)
WBC: 3.6 10*3/uL — ABNORMAL LOW (ref 4.0–10.5)
nRBC: 0 % (ref 0.0–0.2)

## 2022-10-12 SURGERY — IRRIGATION AND DEBRIDEMENT EXTREMITY
Anesthesia: General | Laterality: Right

## 2022-10-12 MED ORDER — PHENYLEPHRINE 80 MCG/ML (10ML) SYRINGE FOR IV PUSH (FOR BLOOD PRESSURE SUPPORT)
PREFILLED_SYRINGE | INTRAVENOUS | Status: AC
  Filled 2022-10-12: qty 10

## 2022-10-12 MED ORDER — DEXMEDETOMIDINE HCL IN NACL 80 MCG/20ML IV SOLN
INTRAVENOUS | Status: DC | PRN
  Administered 2022-10-12: 8 ug via BUCCAL

## 2022-10-12 MED ORDER — ACETAMINOPHEN 325 MG PO TABS
325.0000 mg | ORAL_TABLET | ORAL | Status: DC | PRN
  Administered 2022-10-12: 650 mg via ORAL

## 2022-10-12 MED ORDER — DEXMEDETOMIDINE HCL IN NACL 80 MCG/20ML IV SOLN
INTRAVENOUS | Status: AC
  Filled 2022-10-12: qty 20

## 2022-10-12 MED ORDER — LACTATED RINGERS IV SOLN: INTRAVENOUS | Status: DC

## 2022-10-12 MED ORDER — SCOPOLAMINE 1 MG/3DAYS TD PT72
1.0000 | MEDICATED_PATCH | TRANSDERMAL | Status: DC
  Administered 2022-10-12: 1.5 mg via TRANSDERMAL
  Filled 2022-10-12: qty 1

## 2022-10-12 MED ORDER — TRAMADOL HCL 50 MG PO TABS
50.0000 mg | ORAL_TABLET | Freq: Once | ORAL | Status: AC
  Administered 2022-10-12: 50 mg via ORAL

## 2022-10-12 MED ORDER — FENTANYL CITRATE (PF) 100 MCG/2ML IJ SOLN
25.0000 ug | INTRAMUSCULAR | Status: DC | PRN
  Administered 2022-10-12: 50 ug via INTRAVENOUS

## 2022-10-12 MED ORDER — MIDAZOLAM HCL 2 MG/2ML IJ SOLN
INTRAMUSCULAR | Status: AC
  Filled 2022-10-12: qty 2

## 2022-10-12 MED ORDER — MIDAZOLAM HCL 2 MG/2ML IJ SOLN
2.0000 mg | Freq: Once | INTRAMUSCULAR | Status: AC
  Administered 2022-10-12: 2 mg via INTRAVENOUS
  Filled 2022-10-12: qty 2

## 2022-10-12 MED ORDER — CHLORHEXIDINE GLUCONATE 0.12 % MT SOLN
15.0000 mL | Freq: Once | OROMUCOSAL | Status: AC
  Administered 2022-10-12: 15 mL via OROMUCOSAL
  Filled 2022-10-12: qty 15

## 2022-10-12 MED ORDER — TRAMADOL HCL 50 MG PO TABS: 50.0000 mg | ORAL_TABLET | Freq: Four times a day (QID) | ORAL | 0 refills | Status: AC | PRN

## 2022-10-12 MED ORDER — ONDANSETRON HCL 4 MG/2ML IJ SOLN: 4.0000 mg | Freq: Once | INTRAMUSCULAR | Status: DC | PRN

## 2022-10-12 MED ORDER — FENTANYL CITRATE (PF) 100 MCG/2ML IJ SOLN
INTRAMUSCULAR | Status: AC
  Filled 2022-10-12: qty 2

## 2022-10-12 MED ORDER — PROPOFOL 10 MG/ML IV BOLUS
INTRAVENOUS | Status: AC
  Filled 2022-10-12: qty 20

## 2022-10-12 MED ORDER — PROPOFOL 10 MG/ML IV BOLUS
INTRAVENOUS | Status: DC | PRN
  Administered 2022-10-12: 200 mg via INTRAVENOUS

## 2022-10-12 MED ORDER — CEFAZOLIN SODIUM-DEXTROSE 2-4 GM/100ML-% IV SOLN
2.0000 g | INTRAVENOUS | Status: AC
  Administered 2022-10-12: 2 g via INTRAVENOUS
  Filled 2022-10-12: qty 100

## 2022-10-12 MED ORDER — TRAMADOL HCL 50 MG PO TABS
ORAL_TABLET | ORAL | Status: AC
  Filled 2022-10-12: qty 1

## 2022-10-12 MED ORDER — HYDROMORPHONE HCL 2 MG PO TABS: 2.0000 mg | ORAL_TABLET | Freq: Four times a day (QID) | ORAL | 0 refills | Status: DC | PRN

## 2022-10-12 MED ORDER — ACETAMINOPHEN 325 MG PO TABS
ORAL_TABLET | ORAL | Status: AC
  Filled 2022-10-12: qty 2

## 2022-10-12 MED ORDER — DEXAMETHASONE SODIUM PHOSPHATE 10 MG/ML IJ SOLN
INTRAMUSCULAR | Status: DC | PRN
  Administered 2022-10-12: 4 mg via INTRAVENOUS

## 2022-10-12 MED ORDER — ORAL CARE MOUTH RINSE: 15.0000 mL | Freq: Once | OROMUCOSAL | Status: AC

## 2022-10-12 MED ORDER — LIDOCAINE 2% (20 MG/ML) 5 ML SYRINGE
INTRAMUSCULAR | Status: DC | PRN
  Administered 2022-10-12: 60 mg via INTRAVENOUS

## 2022-10-12 MED ORDER — ONDANSETRON HCL 4 MG/2ML IJ SOLN
INTRAMUSCULAR | Status: DC | PRN
  Administered 2022-10-12: 4 mg via INTRAVENOUS

## 2022-10-12 MED ORDER — OXYCODONE HCL 5 MG/5ML PO SOLN: 5.0000 mg | Freq: Once | ORAL | Status: DC | PRN

## 2022-10-12 MED ORDER — ACETAMINOPHEN 160 MG/5ML PO SOLN: 325.0000 mg | ORAL | Status: DC | PRN

## 2022-10-12 MED ORDER — MEPERIDINE HCL 25 MG/ML IJ SOLN: 6.2500 mg | INTRAMUSCULAR | Status: DC | PRN

## 2022-10-12 MED ORDER — FENTANYL CITRATE (PF) 250 MCG/5ML IJ SOLN
INTRAMUSCULAR | Status: DC | PRN
  Administered 2022-10-12: 25 ug via INTRAVENOUS
  Administered 2022-10-12: 50 ug via INTRAVENOUS

## 2022-10-12 MED ORDER — OXYCODONE HCL 5 MG PO TABS: 5.0000 mg | ORAL_TABLET | Freq: Once | ORAL | Status: DC | PRN

## 2022-10-12 MED ORDER — 0.9 % SODIUM CHLORIDE (POUR BTL) OPTIME
TOPICAL | Status: DC | PRN
  Administered 2022-10-12: 1000 mL

## 2022-10-12 MED ORDER — FENTANYL CITRATE (PF) 250 MCG/5ML IJ SOLN
INTRAMUSCULAR | Status: AC
  Filled 2022-10-12: qty 5

## 2022-10-12 SURGICAL SUPPLY — 39 items
BAG COUNTER SPONGE SURGICOUNT (BAG) IMPLANT
BAG SPNG CNTER NS LX DISP (BAG)
BLADE SURG 21 STRL SS (BLADE) ×1 IMPLANT
BNDG COHESIVE 6X5 TAN STRL LF (GAUZE/BANDAGES/DRESSINGS) IMPLANT
BNDG GAUZE DERMACEA FLUFF 4 (GAUZE/BANDAGES/DRESSINGS) ×2 IMPLANT
BNDG GZE DERMACEA 4 6PLY (GAUZE/BANDAGES/DRESSINGS)
CANISTER PREVENA PLUS 150 (CANNISTER) IMPLANT
CNTNR URN SCR LID CUP LEK RST (MISCELLANEOUS) IMPLANT
CONT SPEC 4OZ STRL OR WHT (MISCELLANEOUS) ×1
COVER SURGICAL LIGHT HANDLE (MISCELLANEOUS) ×2 IMPLANT
DRAPE STERI IOBAN 125X83 (DRAPES) IMPLANT
DRAPE U-SHAPE 47X51 STRL (DRAPES) ×1 IMPLANT
DRESSING PEEL AND PLC PRVNA 13 (GAUZE/BANDAGES/DRESSINGS) IMPLANT
DRSG ADAPTIC 3X8 NADH LF (GAUZE/BANDAGES/DRESSINGS) ×1 IMPLANT
DRSG PEEL AND PLACE PREVENA 13 (GAUZE/BANDAGES/DRESSINGS) ×1
DURAPREP 26ML APPLICATOR (WOUND CARE) ×1 IMPLANT
ELECT REM PT RETURN 9FT ADLT (ELECTROSURGICAL) ×1
ELECTRODE REM PT RTRN 9FT ADLT (ELECTROSURGICAL) IMPLANT
GAUZE SPONGE 4X4 12PLY STRL (GAUZE/BANDAGES/DRESSINGS) ×1 IMPLANT
GLOVE BIOGEL PI IND STRL 9 (GLOVE) ×1 IMPLANT
GLOVE SURG ORTHO 9.0 STRL STRW (GLOVE) ×1 IMPLANT
GOWN STRL REUS W/ TWL XL LVL3 (GOWN DISPOSABLE) ×2 IMPLANT
GOWN STRL REUS W/TWL XL LVL3 (GOWN DISPOSABLE) ×2
GRAFT SKIN WND MICRO 38 (Tissue) IMPLANT
HANDPIECE INTERPULSE COAX TIP (DISPOSABLE)
KIT BASIN OR (CUSTOM PROCEDURE TRAY) ×1 IMPLANT
KIT TURNOVER KIT B (KITS) ×1 IMPLANT
MANIFOLD NEPTUNE II (INSTRUMENTS) ×1 IMPLANT
NS IRRIG 1000ML POUR BTL (IV SOLUTION) ×1 IMPLANT
PACK ORTHO EXTREMITY (CUSTOM PROCEDURE TRAY) ×1 IMPLANT
PAD ARMBOARD 7.5X6 YLW CONV (MISCELLANEOUS) ×2 IMPLANT
SET HNDPC FAN SPRY TIP SCT (DISPOSABLE) IMPLANT
STOCKINETTE IMPERVIOUS 9X36 MD (GAUZE/BANDAGES/DRESSINGS) IMPLANT
SUT ETHILON 2 0 PSLX (SUTURE) ×1 IMPLANT
SWAB COLLECTION DEVICE MRSA (MISCELLANEOUS) ×1 IMPLANT
SWAB CULTURE ESWAB REG 1ML (MISCELLANEOUS) IMPLANT
TOWEL GREEN STERILE (TOWEL DISPOSABLE) ×1 IMPLANT
TUBE CONNECTING 12X1/4 (SUCTIONS) ×1 IMPLANT
YANKAUER SUCT BULB TIP NO VENT (SUCTIONS) ×1 IMPLANT

## 2022-10-12 NOTE — Telephone Encounter (Signed)
This pt had hip debridement surgery today please see message below.

## 2022-10-12 NOTE — Transfer of Care (Signed)
Immediate Anesthesia Transfer of Care Note  Patient: Patricia Adkins  Procedure(s) Performed: RIGHT HIP DEBRIDEMENT (Right)  Patient Location: PACU  Anesthesia Type:General  Level of Consciousness: awake, alert , and patient cooperative  Airway & Oxygen Therapy: Patient Spontanous Breathing  Post-op Assessment: Report given to RN and Post -op Vital signs reviewed and stable  Post vital signs: stable  Last Vitals:  Vitals Value Taken Time  BP 100/70 10/12/22 1306  Temp    Pulse 71 10/12/22 1309  Resp 10 10/12/22 1308  SpO2 99 % 10/12/22 1309  Vitals shown include unvalidated device data.  Last Pain:  Vitals:   10/12/22 1054  TempSrc:   PainSc: 3       Patients Stated Pain Goal: 1 (97/84/78 4128)  Complications: No notable events documented.

## 2022-10-12 NOTE — Anesthesia Procedure Notes (Signed)
Procedure Name: LMA Insertion Date/Time: 10/12/2022 12:25 PM  Performed by: Janace Litten, CRNAPre-anesthesia Checklist: Patient identified, Emergency Drugs available, Suction available and Patient being monitored Patient Re-evaluated:Patient Re-evaluated prior to induction Oxygen Delivery Method: Circle System Utilized Preoxygenation: Pre-oxygenation with 100% oxygen Induction Type: IV induction Ventilation: Mask ventilation without difficulty LMA: LMA inserted LMA Size: 3.0 Number of attempts: 1 Placement Confirmation: positive ETCO2 Tube secured with: Tape Dental Injury: Teeth and Oropharynx as per pre-operative assessment

## 2022-10-12 NOTE — H&P (Signed)
Patricia Adkins is an 52 y.o. female.   Chief Complaint: Right hip wound. HPI: Patient is a 52 year old woman who is seen for initial evaluation for right hip wound. Patient is status post open repair of the gluteus muscles right hip on 06/17/2022. She subsequently had debridement of the hip on 08/31/2022. Patient had no known injury prior to surgery. Patient is currently doing wet-to-dry dressing changes has been on clindamycin since December 16 and completes her antibiotics today.   Past Medical History:  Diagnosis Date   Anemia    Anxiety    Atypical mole 08/03/2006   Left Abdomen (marked) (excision)   Atypical mole 08/03/2006   Lower Back (marked) (excision)   Atypical mole 05/23/2012   Left Low Back (moderate) (wider shave)   Atypical mole 06/28/2012   Left Paraspinal (moderate) (wider shave)   Atypical mole 06/28/2012   Right Lateral Back (mild)   Atypical mole 06/28/2012   Right Upper Buttock (severe) (wider shave)   Atypical mole 06/28/2012   Left Upper Arm (severe) (wider shave)   Atypical mole 07/19/2013   Left Post Shoulder Superior (moderate)   Atypical mole 07/19/2013   Left Post Shoulder Inferior (mild)   Atypical mole 07/19/2013   Right Migh Thigh (moderate)   Atypical mole 05/30/2014   Left Chin (mild)   Atypical mole 05/30/2014   Lower Abdomen (moderate)   Atypical mole 05/30/2014   Left Tricept (unsual spitz tumor) (wider shave)   Atypical mole 10/27/2015   Left Thigh (moderate)   Atypical mole 10/27/2015   Supra Pubic (moderate)   Atypical mole 06/07/2016   Right Post Shoulder (moderate)   Atypical mole 11/06/2017   Right Upper Abdomen (moderate)   Atypical mole 08/01/2018   Left Thigh (moderate) (wider shave)   Atypical mole 08/01/2018   Right Forearm (severe) (excision)   Atypical mole 06/26/2019   Right Outer Mid Back (severe) (wider shave)   Bipolar 2 disorder ([place])    (Dr. Toy Care)- Started after second pregnancy   Chronic kidney disease     Depression    Gastritis    Hypothyroidism    Lumbar degenerative disc disease    Migraine    headaches (Dr. Catalina Gravel)   Mitral valve prolapse    Pneumonia    PONV (postoperative nausea and vomiting)    Thyroid disease     Past Surgical History:  Procedure Laterality Date   ABDOMINAL HYSTERECTOMY     CESAREAN SECTION  10/10/1996   CYSTECTOMY     Uterine   DILATION AND CURETTAGE OF UTERUS     INCISION AND DRAINAGE OF WOUND Right 08/31/2022   Procedure: IRRIGATION AND DEBRIDEMENT HIP WOUND;  Surgeon: Nicholes Stairs, MD;  Location: [facility];  Service: Orthopedics;  Laterality: Right;  60 ok per OR   OPEN SURGICAL REPAIR OF GLUTEAL TENDON Right 06/17/2022   Procedure: RIGHT HIP OPEN REPAIR OF GLUTEAL MEDIUS;  Surgeon: Nicholes Stairs, MD;  Location: [facility];  Service: Orthopedics;  Laterality: Right;  120   PLANTAR FASCIA SURGERY Right    US ECHOCARDIOGRAPHY     Normal EF, mild MR/TR, Normal LV size and function. There were no regional wall motion abnormailities. Left Ventricular ejection fraction estimated by 2D at 60-65%. Mild mitral valve regurgitation. Mild tricuspid regurgitation    Family History  Problem Relation Age of Onset   Allergic rhinitis Mother    Arthritis Mother    Hypertension Father        at a young  age,    Hyperlipidemia Father    Allergic rhinitis Sister    Hypertension Sister    Asthma Son    Social History:  reports that she has never smoked. She has never used smokeless tobacco. She reports that she does not drink alcohol and does not use drugs.  Allergies:  Allergies  Allergen Reactions   Imitrex [Sumatriptan] Anaphylaxis, Swelling and Other (See Comments)    Throat swelling Chest tightness   Codeine Nausea And Vomiting   Entocort Ec [Budesonide] Swelling and Other (See Comments)    Swelling and redness to face   Morphine And Related Nausea And Vomiting   Nsaids Other (See Comments)    Told to avoid NSAIDs due to kidney function   Sulfa  Antibiotics Other (See Comments)    Stomach upset   Tape Other (See Comments)    Red/raised skin at the site    Depakote [Divalproex Sodium] Hives and Rash    No medications prior to admission.    No results found for this or any previous visit (from the past 48 hour(s)). No results found.  Review of Systems  All other systems reviewed and are negative.   There were no vitals taken for this visit. Physical Exam  Patient is alert, oriented, no adenopathy, well-dressed, normal affect, normal respiratory effort. Examination of the right hip patient has a 1 cm ulcer proximally along the incision line and a distal blister 5 mm in diameter distally.  There is undermining to the wound.  No surrounding cellulitis. Assessment/Plan Assessment: Wound right hip status post gluteus muscle reconstruction.  Plan: Will plan for debridement of the wound and removal of any deep retained anchors or sutures.  Will send tissue for cultures.  Newt Minion, MD 10/12/2022, 6:40 AM

## 2022-10-12 NOTE — Telephone Encounter (Signed)
Called and lm on vm to advise rx sent to pharm as requested and to call with any questions.

## 2022-10-12 NOTE — Anesthesia Preprocedure Evaluation (Addendum)
Anesthesia Evaluation  Patient identified by MRN, date of birth, ID band Patient awake    Reviewed: Allergy & Precautions, H&P , NPO status , Patient's Chart, lab work & pertinent test results  History of Anesthesia Complications (+) PONV and history of anesthetic complications  Airway Mallampati: I   Neck ROM: full    Dental  (+) Teeth Intact, Dental Advisory Given, Caps,  BRACES:   Pulmonary pneumonia   breath sounds clear to auscultation       Cardiovascular negative cardio ROS  Rhythm:regular Rate:Normal     Neuro/Psych  Headaches PSYCHIATRIC DISORDERS Anxiety Depression Bipolar Disorder      GI/Hepatic   Endo/Other  Hypothyroidism    Renal/GU Renal disease     Musculoskeletal  (+) Arthritis ,    Abdominal   Peds  Hematology  (+) Blood dyscrasia, anemia   Anesthesia Other Findings   Reproductive/Obstetrics                             Anesthesia Physical Anesthesia Plan  ASA: 3  Anesthesia Plan: General   Post-op Pain Management: Minimal or no pain anticipated   Induction: Intravenous  PONV Risk Score and Plan: 4 or greater and Ondansetron, Dexamethasone, Scopolamine patch - Pre-op, Midazolam, Treatment may vary due to age or medical condition and TIVA  Airway Management Planned: Oral ETT and LMA  Additional Equipment: None  Intra-op Plan:   Post-operative Plan: Extubation in OR  Informed Consent: I have reviewed the patients History and Physical, chart, labs and discussed the procedure including the risks, benefits and alternatives for the proposed anesthesia with the patient or authorized representative who has indicated his/her understanding and acceptance.     Dental advisory given  Plan Discussed with: CRNA and Anesthesiologist  Anesthesia Plan Comments:        Anesthesia Quick Evaluation

## 2022-10-12 NOTE — Telephone Encounter (Signed)
Pt called requesting her [facility] [place]. Pt regular pharmacy is out of stock. Please call pt when sent to pharmacy. Pt is asking for it to be sent right away. Pt phone number is [phone].

## 2022-10-12 NOTE — Op Note (Signed)
10/12/2022  1:18 PM  PATIENT:  Patricia Adkins    PRE-OPERATIVE DIAGNOSIS:  Infected Right Hip  POST-OPERATIVE DIAGNOSIS:  Same  PROCEDURE:  RIGHT HIP DEBRIDEMENT Excision skin and soft tissue muscle bone. Local tissue rearrangement for wound closure 10 x 4 cm. Application of Kerecis micro graft 38 cm. Application of a Prevena wound VAC 13 cm. Tissue and anchors sent for cultures.  SURGEON:  Newt Minion, MD  PHYSICIAN ASSISTANT:None ANESTHESIA:   General  PREOPERATIVE INDICATIONS:  Patricia Adkins is a  52 y.o. female with a diagnosis of Infected Right Hip who failed conservative measures and elected for surgical management.    The risks benefits and alternatives were discussed with the patient preoperatively including but not limited to the risks of infection, bleeding, nerve injury, cardiopulmonary complications, the need for revision surgery, among others, and the patient was willing to proceed.  OPERATIVE IMPLANTS: Kerecis micro graft 38 cm  '@ENCIMAGES'$ @  OPERATIVE FINDINGS: Patient had infection involving the anchors, braided suture and bursa.  Tissue and anchors and braided sutures sent for cultures.  OPERATIVE PROCEDURE: Patient brought the operating room and underwent a general anesthetic.  After adequate levels anesthesia obtained patient's right hip was prepped using DuraPrep draped into a sterile field a timeout was called.  Elliptical incision was made around the draining ulcers that left a wound that was 10 x 4 cm.  This was carried down to the greater trochanter bursa.  The bursa was incised and resected.  The retained sutures and anchors were also removed.  This included removing a small amount of bone.  All tissue was sent for cultures.  Tissue margins were healthy and viable retained braided suture was removed.  The wound was irrigated with normal saline electrocautery was used hemostasis.  Kerecis micro graft 38 cm was applied.  Local tissue rearrangement was used to  close the wound 10 x 4 cm.  A 13 cm Prevena wound VAC was applied this had a good suction fit patient was extubated taken to PACU in stable condition.   DISCHARGE PLANNING:  Antibiotic duration: Will start antibiotics depending on tissue cultures.  Weightbearing: Weightbearing as tolerated  Pain medication: Prescription for Dilaudid and Ultram as per patient request  Dressing care/ Wound VAC: Wound VAC continue for 1 week.  Ambulatory devices: None  Discharge to: Home.  Follow-up: In the office 1 week post operative.

## 2022-10-13 ENCOUNTER — Telehealth: Payer: Self-pay | Admitting: Orthopedic Surgery

## 2022-10-13 ENCOUNTER — Encounter (HOSPITAL_COMMUNITY): Payer: Self-pay | Admitting: Orthopedic Surgery

## 2022-10-13 NOTE — Telephone Encounter (Signed)
Called pt and advised that the wound vac will stay intact until her post op appt I sch this with the pt for 10/20/2021 at 10:45. She will call with any other questions.

## 2022-10-13 NOTE — Anesthesia Postprocedure Evaluation (Signed)
Anesthesia Post Note  Patient: Luiza Carranco [place]  Procedure(s) Performed: RIGHT HIP DEBRIDEMENT (Right)     Patient location during evaluation: PACU Anesthesia Type: General Level of consciousness: awake and alert Pain management: pain level controlled Vital Signs Assessment: post-procedure vital signs reviewed and stable Respiratory status: spontaneous breathing, nonlabored ventilation, respiratory function stable and patient connected to nasal cannula oxygen Cardiovascular status: blood pressure returned to baseline and stable Postop Assessment: no apparent nausea or vomiting Anesthetic complications: no  No notable events documented.  Last Vitals:  Vitals:   10/12/22 1330 10/12/22 1400  BP: 101/62 121/80  Pulse: 61 (!) 56  Resp: 14 20  Temp:  36.5 C  SpO2: 99% 100%    Last Pain:  Vitals:   10/12/22 1400  TempSrc:   PainSc: 3    Pain Goal: Patients Stated Pain Goal: 1 (10/12/22 1054)                 Nello Corro

## 2022-10-13 NOTE — Telephone Encounter (Signed)
Patient states she want to know when she is supposed to be done about the wound vac and the dressing.Marland Kitchen336 [id number]

## 2022-10-15 ENCOUNTER — Encounter: Payer: Self-pay | Admitting: Orthopedic Surgery

## 2022-10-17 ENCOUNTER — Other Ambulatory Visit: Payer: Self-pay | Admitting: Physician Assistant

## 2022-10-17 ENCOUNTER — Telehealth: Payer: Self-pay | Admitting: Orthopedic Surgery

## 2022-10-17 ENCOUNTER — Ambulatory Visit (INDEPENDENT_AMBULATORY_CARE_PROVIDER_SITE_OTHER): Payer: BC Managed Care – PPO | Admitting: Orthopedic Surgery

## 2022-10-17 DIAGNOSIS — T8149XA Infection following a procedure, other surgical site, initial encounter: Secondary | ICD-10-CM

## 2022-10-17 LAB — AEROBIC/ANAEROBIC CULTURE W GRAM STAIN (SURGICAL/DEEP WOUND)
Culture: NO GROWTH
Gram Stain: NONE SEEN

## 2022-10-17 MED ORDER — HYDROMORPHONE HCL 2 MG PO TABS: 2.0000 mg | ORAL_TABLET | Freq: Four times a day (QID) | ORAL | 0 refills | Status: AC | PRN

## 2022-10-17 MED ORDER — HYDROMORPHONE HCL 2 MG PO TABS: 2.0000 mg | ORAL_TABLET | Freq: Four times a day (QID) | ORAL | 0 refills | Status: DC | PRN

## 2022-10-17 NOTE — Telephone Encounter (Signed)
Patient called. She would like the [facility] [place] [facility] the [employer]/[phone] is their phone number. [place]

## 2022-10-18 DIAGNOSIS — R002 Palpitations: Secondary | ICD-10-CM | POA: Diagnosis not present

## 2022-10-19 ENCOUNTER — Emergency Department (HOSPITAL_BASED_OUTPATIENT_CLINIC_OR_DEPARTMENT_OTHER)
Admission: EM | Admit: 2022-10-19 | Discharge: 2022-10-19 | Disposition: A | Discharge status: Home / Self Care | Payer: BC Managed Care – PPO | Attending: Emergency Medicine | Admitting: Emergency Medicine

## 2022-10-19 ENCOUNTER — Encounter: Payer: Self-pay | Admitting: Orthopedic Surgery

## 2022-10-19 ENCOUNTER — Other Ambulatory Visit: Payer: Self-pay

## 2022-10-19 ENCOUNTER — Encounter (HOSPITAL_BASED_OUTPATIENT_CLINIC_OR_DEPARTMENT_OTHER): Payer: Self-pay

## 2022-10-19 DIAGNOSIS — R531 Weakness: Secondary | ICD-10-CM | POA: Diagnosis not present

## 2022-10-19 DIAGNOSIS — R002 Palpitations: Secondary | ICD-10-CM | POA: Diagnosis not present

## 2022-10-19 LAB — CBC WITH DIFFERENTIAL/PLATELET
Abs Immature Granulocytes: 0.01 10*3/uL (ref 0.00–0.07)
Basophils Absolute: 0 10*3/uL (ref 0.0–0.1)
Basophils Relative: 0 %
Eosinophils Absolute: 0 10*3/uL (ref 0.0–0.5)
Eosinophils Relative: 0 %
HCT: 33 % — ABNORMAL LOW (ref 36.0–46.0)
Hemoglobin: 10.9 g/dL — ABNORMAL LOW (ref 12.0–15.0)
Immature Granulocytes: 0 %
Lymphocytes Relative: 10 %
Lymphs Abs: 0.6 10*3/uL — ABNORMAL LOW (ref 0.7–4.0)
MCH: 28.8 pg (ref 26.0–34.0)
MCHC: 33 g/dL (ref 30.0–36.0)
MCV: 87.1 fL (ref 80.0–100.0)
Monocytes Absolute: 0.6 10*3/uL (ref 0.1–1.0)
Monocytes Relative: 11 %
Neutro Abs: 4.2 10*3/uL (ref 1.7–7.7)
Neutrophils Relative %: 79 %
Platelets: 200 10*3/uL (ref 150–400)
RBC: 3.79 MIL/uL — ABNORMAL LOW (ref 3.87–5.11)
RDW: 12.3 % (ref 11.5–15.5)
WBC: 5.4 10*3/uL (ref 4.0–10.5)
nRBC: 0 % (ref 0.0–0.2)

## 2022-10-19 LAB — BASIC METABOLIC PANEL
Anion gap: 11 (ref 5–15)
BUN: 17 mg/dL (ref 6–20)
CO2: 25 mmol/L (ref 22–32)
Calcium: 9.2 mg/dL (ref 8.9–10.3)
Chloride: 104 mmol/L (ref 98–111)
Creatinine, Ser: 0.82 mg/dL (ref 0.44–1.00)
GFR, Estimated: >60 mL/min (ref >60)
Glucose, Bld: 106 mg/dL — ABNORMAL HIGH (ref 70–99)
Potassium: 4 mmol/L (ref 3.5–5.1)
Sodium: 140 mmol/L (ref 135–145)

## 2022-10-19 LAB — MAGNESIUM: Magnesium: 1.8 mg/dL (ref 1.7–2.4)

## 2022-10-19 MED ORDER — SODIUM CHLORIDE 0.9 % IV BOLUS
1000.0000 mL | Freq: Once | INTRAVENOUS | Status: AC
  Administered 2022-10-19: 1000 mL via INTRAVENOUS

## 2022-10-19 NOTE — ED Provider Notes (Signed)
[facility]ER[facility] DEPT Provider Note   CSN: [id number] Arrival date & time: 10/19/22  8250     History  Chief Complaint  Patient presents with   Palpitations    Patricia Adkins is a 52 y.o. female.  Patient here with palpitation, weakness.  Had surgery to the right hip for wound repair recently.  Having palpitations since.  Patricia Adkins is on some narcotic pain medicine.  Was seen by primary care doctor yesterday but unable to get lab work back yet.  Patricia Adkins denies any chest pain or shortness of breath or abdominal pain.  Patricia Adkins is having palpitations on and off.  Denies any weakness numbness or speech changes.  No headaches.  Has been trying to do better with drinking fluids and keeping up with nutrition.  No issues with surgical wound site on the right hip.  The history is provided by the patient.       Home Medications Prior to Admission medications   Medication Sig Start Date End Date Taking? Authorizing Provider  acetaminophen (TYLENOL) 500 MG tablet Take 1,000 mg by mouth 2 (two) times daily as needed for mild pain, fever or headache.    [provider]  AIMOVIG 140 MG/ML SOAJ Inject 140 mg into the skin every 30 (thirty) days. 5th of each month 04/08/22   [provider]  Bacillus Coagulans-Inulin (ALIGN PREBIOTIC-PROBIOTIC PO) Take 2 capsules by mouth daily.    [provider]  Boswellia-Glucosamine-Vit D (OSTEO BI-FLEX ONE PER DAY) TABS Take 1 tablet by mouth in the morning.    [provider]  Cholecalciferol (VITAMIN D3) 50 MCG (2000 UT) CAPS Take 2,000 Units by mouth daily.    [provider]  clonazePAM (KLONOPIN) 1 MG tablet Take 1 mg by mouth 3 (three) times daily.    [provider]  diclofenac Sodium (VOLTAREN) 1 % GEL Apply 1 Application topically 2 (two) times daily as needed (hip pain, knee pain).    [provider]  diphenhydrAMINE (BENADRYL) 25 MG tablet Take 25 mg by mouth at bedtime.    [provider]  fluticasone (FLONASE) 50 MCG/ACT nasal spray Place 1 spray into both nostrils daily as needed for allergies. 08/11/17   [provider]  HYDROmorphone (DILAUDID) 2 MG tablet Take 1 tablet (2 mg total) by mouth every 6 (six) hours as needed for up to 5 days for severe pain. 10/17/22 10/22/22  Newt Minion, MD  lamoTRIgine (LAMICTAL) 200 MG tablet Take 200 mg by mouth at bedtime. 12/20/17   [provider]  levothyroxine (SYNTHROID) 150 MCG tablet Take 150-300 mcg by mouth See admin instructions. Take 150 mcg every evening except Mondays take 300 mcg in the evening    [provider]  lumateperone tosylate (CAPLYTA) 42 MG capsule Take 42 mg by mouth at bedtime.    [provider]  METRONIDAZOLE, TOPICAL, 0.75 % LOTN Apply 1 Application topically at bedtime. 04/27/22   Lavonna Monarch, MD  Multiple Vitamins-Minerals (MULTIVITAMIN WOMEN 50+) TABS Take 1 tablet by mouth in the morning.    [provider]  Naphazoline HCl (CLEAR EYES OP) Place 1 drop into both eyes daily as needed (burning eyes).    [provider]  ondansetron (ZOFRAN) 4 MG tablet Take 1 tablet (4 mg total) by mouth every 8 (eight) hours as needed for nausea or vomiting. Patient not taking: Reported on 10/11/2022 08/31/22   Nicholes Stairs, MD  Pancrelipase, Lip-Prot-Amyl, (ZENPEP) [phone] units CPEP Take 40,000-80,000  units of lipase by mouth See admin instructions. 80,000 units of lipase with every meal, 40,000 units with every snack    [provider]  PEPPERMINT OIL PO Take 1 capsule by mouth in the morning.    [provider]  polyethylene glycol (MIRALAX / GLYCOLAX) 17 g packet Take 17 g by mouth daily.    [provider]  promethazine (PHENERGAN) 25 MG tablet Take 25 mg by mouth daily as needed for nausea or vomiting.    [provider]  Rimegepant Sulfate (NURTEC) 75 MG TBDP Take 75 mg by mouth daily as needed (migraine).  07/20/20   [provider]  saccharomyces boulardii (FLORASTOR) 250 MG capsule Take 500 mg by mouth in the morning.    [provider]  Simethicone (GAS RELIEF) 180 MG CAPS Take 180 mg by mouth See admin instructions. Take 180 mg daily, may take an additional 180 mg dose as needed for gas    [provider]  tiZANidine (ZANAFLEX) 2 MG tablet Take 2 mg by mouth every 8 (eight) hours as needed for muscle spasms.    [provider]  traMADol (ULTRAM) 50 MG tablet Take 1 tablet (50 mg total) by mouth every 6 (six) hours as needed. 10/12/22 10/12/23  Newt Minion, MD  Turmeric (QC TUMERIC COMPLEX PO) Take 1 tablet by mouth in the morning.    [provider]      Allergies    Imitrex [sumatriptan], Codeine, Entocort ec [budesonide], Gluten meal, Morphine and related, Nsaids, Sulfa antibiotics, Tape, and Depakote [divalproex sodium]    Review of Systems   Review of Systems  Physical Exam Updated Vital Signs BP 111/64 (BP Location: Left Arm)   Pulse 82   Temp (!) 97.4 F (36.3 C) (Oral)   Resp 18   Ht '5\' 9"'$  (1.753 m)   Wt 78.5 kg   SpO2 99%   BMI 25.55 kg/m  Physical Exam Vitals and nursing note reviewed.  Constitutional:      General: Patricia Adkins is not in acute distress.    Appearance: Patricia Adkins is well-developed. Patricia Adkins is not ill-appearing.  HENT:     Head: Normocephalic and atraumatic.     Nose: Nose normal.     Mouth/Throat:     Mouth: Mucous membranes are moist.  Eyes:     Extraocular Movements: Extraocular movements intact.     Conjunctiva/sclera: Conjunctivae normal.     Pupils: Pupils are equal, round, and reactive to light.  Cardiovascular:     Rate and Rhythm: Normal rate and regular rhythm.     Pulses: Normal pulses.     Heart sounds: Normal heart sounds. No murmur heard. Pulmonary:     Effort: Pulmonary effort is normal. No respiratory distress.     Breath sounds: Normal breath sounds.  Abdominal:     Palpations: Abdomen is soft.      Tenderness: There is no abdominal tenderness.  Musculoskeletal:        General: No swelling.     Cervical back: Neck supple.     Right lower leg: No edema.     Left lower leg: No edema.  Skin:    General: Skin is warm and dry.     Capillary Refill: Capillary refill takes less than 2 seconds.     Comments: Surgical site on the right hip appears to be clean dry and intact, dressing overlying as well.  Neurological:     General: No focal deficit present.  Mental Status: Patricia Adkins is alert and oriented to person, place, and time.     Cranial Nerves: No cranial nerve deficit.     Sensory: No sensory deficit.     Motor: No weakness.     Coordination: Coordination normal.  Psychiatric:        Mood and Affect: Mood normal.     ED Results / Procedures / Treatments   Labs (all labs ordered are listed, but only abnormal results are displayed) Labs Reviewed  CBC WITH DIFFERENTIAL/PLATELET - Abnormal; Notable for the following components:      Result Value   RBC 3.79 (*)    Hemoglobin 10.9 (*)    HCT 33.0 (*)    Lymphs Abs 0.6 (*)    All other components within normal limits  BASIC METABOLIC PANEL - Abnormal; Notable for the following components:   Glucose, Bld 106 (*)    All other components within normal limits  MAGNESIUM    EKG EKG Interpretation  Date/Time:  Wednesday October 19 2022 09:42:15 EST Ventricular Rate:  89 PR Interval:  155 QRS Duration: 101 QT Interval:  361 QTC Calculation: 440 R Axis:   89 Text Interpretation: Sinus rhythm Probable left atrial enlargement Confirmed by Ronnald Nian, Reiana Poteet ([id number]) on 10/19/2022 9:51:11 AM  Radiology No results found.  Procedures Procedures    Medications Ordered in ED Medications  sodium chloride 0.9 % bolus 1,000 mL (1,000 mLs Intravenous New Bag/Given 10/19/22 1010)    ED Course/ Medical Decision Making/ A&P                           Medical Decision Making Amount and/or Complexity of Data Reviewed Labs:  ordered.   Patricia Adkins is here with palpitations.  History of bipolar disorder, anxiety, depression, chronic kidney disease and mitral valve prolapse.  Patricia Adkins had surgery to do wound repair on her right hip a couple days ago and has had some palpitations since.  Patricia Adkins is on narcotic pain medicine.  Patricia Adkins has been having some intermittent palpitations but denies any chest pain or shortness of breath.  No swelling in her legs.  Surgical wound site appears well.  Patricia Adkins has no respiratory symptoms.  Patricia Adkins has normal vitals.  No fever.  EKG shows sinus rhythm.  No ischemic changes.  My suspicion is that this could be from postop recovery versus electrolyte abnormality versus anemia.  Will give IV fluid bolus check electrolytes and blood counts.  Patricia Adkins did not stay overnight after the surgery Patricia Adkins has been ambulatory since the surgery.  Per my review and interpretation the labs is no significant electrolyte abnormality or kidney injury or leukocytosis.  Lab work is overall unremarkable.  While here on telemetry I did not see any episodes of PACs or PVCs or other abnormal heart rhythms.  Patricia Adkins is given reassurance.  Patricia Adkins is already been arranged for cardiology follow-up.  Her hemoglobin is a little bit lower than it was preop and suspect that this is may be some palpitations as Patricia Adkins is adjusting to her postsurgery state.  Discharged in good condition.  This chart was dictated using voice recognition software.  Despite best efforts to proofread,  errors can occur which can change the documentation meaning.         Final Clinical Impression(s) / ED Diagnoses Final diagnoses:  Palpitations    Rx / DC Orders ED Discharge Orders     None  Lennice Sites, DO 10/19/22 1051

## 2022-10-19 NOTE — ED Notes (Signed)
Pt told home medications ('2mg'$  Hydromorphone and '2mg'$  Zofran) w/  EDP permission.

## 2022-10-19 NOTE — ED Triage Notes (Addendum)
Pt c/o heart palpitations x6 days.  Pt reports being seen by PCP yesterday and was told her EKG was abnormal.  Denies SOB, n/v/d, and pain.  Sts "I dont feel like I'm breathing right."   Pt has recent surgery on R hip, but has not been started on any new medications.

## 2022-10-19 NOTE — ED Notes (Signed)
ED Provider at bedside. 

## 2022-10-20 ENCOUNTER — Encounter: Payer: BC Managed Care – PPO | Admitting: Orthopedic Surgery

## 2022-10-21 ENCOUNTER — Encounter: Payer: Self-pay | Admitting: Orthopedic Surgery

## 2022-10-21 ENCOUNTER — Other Ambulatory Visit: Payer: BC Managed Care – PPO

## 2022-10-21 ENCOUNTER — Ambulatory Visit: Payer: BC Managed Care – PPO | Admitting: Cardiology

## 2022-10-21 ENCOUNTER — Encounter: Payer: Self-pay | Admitting: Cardiology

## 2022-10-21 ENCOUNTER — Other Ambulatory Visit: Payer: Self-pay | Admitting: Orthopedic Surgery

## 2022-10-21 VITALS — BP 127/74 | HR 99 | Resp 16 | Ht 69.0 in | Wt 177.0 lb

## 2022-10-21 DIAGNOSIS — R9431 Abnormal electrocardiogram [ECG] [EKG]: Secondary | ICD-10-CM | POA: Diagnosis not present

## 2022-10-21 DIAGNOSIS — R002 Palpitations: Secondary | ICD-10-CM | POA: Insufficient documentation

## 2022-10-21 MED ORDER — DOXYCYCLINE HYCLATE 100 MG PO TABS: 100.0000 mg | ORAL_TABLET | Freq: Two times a day (BID) | ORAL | 0 refills | Status: AC

## 2022-10-21 NOTE — Progress Notes (Signed)
Patient is seen for evaluation of the wound VAC.  The wound VAC is functioning well.  Patient is 5 days out from surgery.  Wound VAC is removed.

## 2022-10-21 NOTE — Progress Notes (Signed)
Patient referred by Aura Dials, MD for palpitations  Subjective:   Patricia Adkins, female    DOB: 09/19/1971, 52 y.o.   MRN: [id number]   Chief Complaint  Patient presents with   Palpitations   New Patient (Initial Visit)     HPI  52 y.o. Caucasian female with hypothyroidism, bipolar disorder, anxiety, evaluation of palpitations.  Patient recently underwent surgeries on her gluteus muscles with reconstruction due to tendon rupture, is currently undergoing physical therapy.  She is on narcotic medications for pain control for the same.  In the past few weeks, she has noticed palpitations with numerous episodes occurring throughout the day, lasting for few seconds, with no particular chest pain or shortness of breath.  Patient was recently seen in the ER, no acute abnormalities were found.  Patient reportedly has history of mitral valve prolapse.  Past Medical History:  Diagnosis Date   Anemia    Anxiety    Atypical mole 08/03/2006   Left Abdomen (marked) (excision)   Atypical mole 08/03/2006   Lower Back (marked) (excision)   Atypical mole 05/23/2012   Left Low Back (moderate) (wider shave)   Atypical mole 06/28/2012   Left Paraspinal (moderate) (wider shave)   Atypical mole 06/28/2012   Right Lateral Back (mild)   Atypical mole 06/28/2012   Right Upper Buttock (severe) (wider shave)   Atypical mole 06/28/2012   Left Upper Arm (severe) (wider shave)   Atypical mole 07/19/2013   Left Post Shoulder Superior (moderate)   Atypical mole 07/19/2013   Left Post Shoulder Inferior (mild)   Atypical mole 07/19/2013   Right Migh Thigh (moderate)   Atypical mole 05/30/2014   Left Chin (mild)   Atypical mole 05/30/2014   Lower Abdomen (moderate)   Atypical mole 05/30/2014   Left Tricept (unsual spitz tumor) (wider shave)   Atypical mole 10/27/2015   Left Thigh (moderate)   Atypical mole 10/27/2015   Supra Pubic (moderate)   Atypical mole 06/07/2016   Right Post  Shoulder (moderate)   Atypical mole 11/06/2017   Right Upper Abdomen (moderate)   Atypical mole 08/01/2018   Left Thigh (moderate) (wider shave)   Atypical mole 08/01/2018   Right Forearm (severe) (excision)   Atypical mole 06/26/2019   Right Outer Mid Back (severe) (wider shave)   Bipolar 2 disorder ([place])    (Dr. Toy Care)- Started after second pregnancy   Chronic kidney disease    Depression    Gastritis    Hypothyroidism    Lumbar degenerative disc disease    Migraine    headaches (Dr. Catalina Gravel)   Mitral valve prolapse    Pneumonia    PONV (postoperative nausea and vomiting)    Thyroid disease      Past Surgical History:  Procedure Laterality Date   ABDOMINAL HYSTERECTOMY     CESAREAN SECTION  10/10/1996   CYSTECTOMY     Uterine   DILATION AND CURETTAGE OF UTERUS     I & D EXTREMITY Right 10/12/2022   Procedure: RIGHT HIP DEBRIDEMENT;  Surgeon: Newt Minion, MD;  Location: [facility];  Service: Orthopedics;  Laterality: Right;   INCISION AND DRAINAGE OF WOUND Right 08/31/2022   Procedure: IRRIGATION AND DEBRIDEMENT HIP WOUND;  Surgeon: Nicholes Stairs, MD;  Location: [facility];  Service: Orthopedics;  Laterality: Right;  60 ok per OR   OPEN SURGICAL REPAIR OF GLUTEAL TENDON Right 06/17/2022   Procedure: RIGHT HIP OPEN REPAIR OF GLUTEAL MEDIUS;  Surgeon: Victorino December  Saralyn Pilar, MD;  Location: [facility];  Service: Orthopedics;  Laterality: Right;  120   PLANTAR FASCIA SURGERY Right    US ECHOCARDIOGRAPHY     Normal EF, mild MR/TR, Normal LV size and function. There were no regional wall motion abnormailities. Left Ventricular ejection fraction estimated by 2D at 60-65%. Mild mitral valve regurgitation. Mild tricuspid regurgitation     Social History   Tobacco Use  Smoking Status Never  Smokeless Tobacco Never    Social History   Substance and Sexual Activity  Alcohol Use No     Family History  Problem Relation Age of Onset   Allergic rhinitis Mother    Arthritis Mother     Hypertension Father        at a young age,    Hyperlipidemia Father    Allergic rhinitis Sister    Hypertension Sister    Asthma Son       Current Outpatient Medications:    acetaminophen (TYLENOL) 500 MG tablet, Take 1,000 mg by mouth 2 (two) times daily as needed for mild pain, fever or headache., Disp: , Rfl:    AIMOVIG 140 MG/ML SOAJ, Inject 140 mg into the skin every 30 (thirty) days. 5th of each month, Disp: , Rfl:    Bacillus Coagulans-Inulin (ALIGN PREBIOTIC-PROBIOTIC PO), Take 2 capsules by mouth daily., Disp: , Rfl:    Boswellia-Glucosamine-Vit D (OSTEO BI-FLEX ONE PER DAY) TABS, Take 1 tablet by mouth in the morning., Disp: , Rfl:    Cholecalciferol (VITAMIN D3) 50 MCG (2000 UT) CAPS, Take 2,000 Units by mouth daily., Disp: , Rfl:    clonazePAM (KLONOPIN) 1 MG tablet, Take 1 mg by mouth 3 (three) times daily., Disp: , Rfl:    diclofenac Sodium (VOLTAREN) 1 % GEL, Apply 1 Application topically 2 (two) times daily as needed (hip pain, knee pain)., Disp: , Rfl:    diphenhydrAMINE (BENADRYL) 25 MG tablet, Take 25 mg by mouth at bedtime., Disp: , Rfl:    fluticasone (FLONASE) 50 MCG/ACT nasal spray, Place 1 spray into both nostrils daily as needed for allergies., Disp: , Rfl:    HYDROmorphone (DILAUDID) 2 MG tablet, Take 1 tablet (2 mg total) by mouth every 6 (six) hours as needed for up to 5 days for severe pain., Disp: 20 tablet, Rfl: 0   lamoTRIgine (LAMICTAL) 200 MG tablet, Take 200 mg by mouth at bedtime., Disp: , Rfl:    levothyroxine (SYNTHROID) 150 MCG tablet, Take 150-300 mcg by mouth See admin instructions. Take 150 mcg every evening except Mondays take 300 mcg in the evening, Disp: , Rfl:    lumateperone tosylate (CAPLYTA) 42 MG capsule, Take 42 mg by mouth at bedtime., Disp: , Rfl:    METRONIDAZOLE, TOPICAL, 0.75 % LOTN, Apply 1 Application topically at bedtime., Disp: 59 mL, Rfl: 11   Multiple Vitamins-Minerals (MULTIVITAMIN WOMEN 50+) TABS, Take 1 tablet by mouth in  the morning., Disp: , Rfl:    Naphazoline HCl (CLEAR EYES OP), Place 1 drop into both eyes daily as needed (burning eyes)., Disp: , Rfl:    ondansetron (ZOFRAN) 4 MG tablet, Take 1 tablet (4 mg total) by mouth every 8 (eight) hours as needed for nausea or vomiting. (Patient not taking: Reported on 10/11/2022), Disp: 20 tablet, Rfl: 0   Pancrelipase, Lip-Prot-Amyl, (ZENPEP) 40000-126000 units CPEP, Take 40,000-80,000 units of lipase by mouth See admin instructions. 80,000 units of lipase with every meal, 40,000 units with every snack, Disp: , Rfl:    PEPPERMINT OIL  PO, Take 1 capsule by mouth in the morning., Disp: , Rfl:    polyethylene glycol (MIRALAX / GLYCOLAX) 17 g packet, Take 17 g by mouth daily., Disp: , Rfl:    promethazine (PHENERGAN) 25 MG tablet, Take 25 mg by mouth daily as needed for nausea or vomiting., Disp: , Rfl:    Rimegepant Sulfate (NURTEC) 75 MG TBDP, Take 75 mg by mouth daily as needed (migraine)., Disp: , Rfl:    saccharomyces boulardii (FLORASTOR) 250 MG capsule, Take 500 mg by mouth in the morning., Disp: , Rfl:    Simethicone (GAS RELIEF) 180 MG CAPS, Take 180 mg by mouth See admin instructions. Take 180 mg daily, may take an additional 180 mg dose as needed for gas, Disp: , Rfl:    tiZANidine (ZANAFLEX) 2 MG tablet, Take 2 mg by mouth every 8 (eight) hours as needed for muscle spasms., Disp: , Rfl:    traMADol (ULTRAM) 50 MG tablet, Take 1 tablet (50 mg total) by mouth every 6 (six) hours as needed., Disp: 20 tablet, Rfl: 0   Turmeric (QC TUMERIC COMPLEX PO), Take 1 tablet by mouth in the morning., Disp: , Rfl:    Cardiovascular and other pertinent studies:  Reviewed external labs and tests, independently interpreted  EKG 10/21/2022: Sinus rhythm 81 bpm Occasional ectopic ventricular beat   Incomplete right bundle branch block Borderline left atrial enlargement Nonspecific ST depression     EKG 10/18/2022: Sinus rhythm 81 bpm Inferolateral ST depression/TWI,  consider ischemia  Recent labs: 10/18/2022: TSH 0.19  09/21/2022: Glucose 80, BUN/Cr 15/0.85. EGFR 82. Na/K 143/4.5. Rest of the CMP normal TSH 0.96 normal    Review of Systems  Cardiovascular:  Positive for palpitations. Negative for chest pain, dyspnea on exertion, leg swelling and syncope.  Psychiatric/Behavioral:  The patient is nervous/anxious.         Vitals:   10/21/22 1001  BP: 127/74  Pulse: 99  Resp: 16  SpO2: 95%     Body mass index is 26.14 kg/m. Filed Weights   10/21/22 1001  Weight: 177 lb (80.3 kg)     Objective:   Physical Exam Vitals and nursing note reviewed.  Constitutional:      General: She is not in acute distress. Neck:     Vascular: No JVD.  Cardiovascular:     Rate and Rhythm: Normal rate and regular rhythm.     Heart sounds: Normal heart sounds. No murmur heard. Pulmonary:     Effort: Pulmonary effort is normal.     Breath sounds: Normal breath sounds. No wheezing or rales.  Musculoskeletal:     Right lower leg: No edema.     Left lower leg: No edema.          Visit diagnoses:   ICD-10-CM   1. Palpitations  R00.2 EKG 12-Lead    LONG TERM MONITOR (3-14 DAYS)    2. Abnormal EKG  R94.31 PCV ECHOCARDIOGRAM COMPLETE       Orders Placed This Encounter  Procedures   EKG 12-Lead     Medication changes this visit: There are no discontinued medications.  No orders of the defined types were placed in this encounter.    Assessment & Recommendations:    52 y.o. Caucasian female with hypothyroidism, bipolar disorder, anxiety, evaluation of palpitations.  Palpitations: EKG today shows incomplete right branch block and occasional PVC.  Her symptoms are most likely related to PVCs.  I will obtain to be cardiac telemetry and echocardiogram.  If  possible, patient would like to avoid any medications.  A separate note, I would encourage her to follow-up with her endocrinologist regarding management of Thyroid and.  Her TSH is  notably low than a month ago.  This may need further follow-up in near future in case her levothyroxine dose may need to be adjusted.  Further recommendations after above testing.  Thank you for referring the patient to [place]. Please feel free to contact with any questions.   Nigel Mormon, MD Pager: [phone] Office: [phone]

## 2022-10-22 ENCOUNTER — Encounter: Payer: Self-pay | Admitting: Orthopedic Surgery

## 2022-10-23 ENCOUNTER — Encounter: Payer: Self-pay | Admitting: Orthopedic Surgery

## 2022-10-24 ENCOUNTER — Encounter: Payer: Self-pay | Admitting: Orthopedic Surgery

## 2022-10-25 ENCOUNTER — Encounter: Payer: Self-pay | Admitting: Orthopedic Surgery

## 2022-10-25 NOTE — Telephone Encounter (Signed)
Looks ok, continue daily cleansing and dry dressings.  Please reassure patient

## 2022-10-26 ENCOUNTER — Ambulatory Visit (INDEPENDENT_AMBULATORY_CARE_PROVIDER_SITE_OTHER): Payer: BC Managed Care – PPO | Admitting: Family

## 2022-10-26 ENCOUNTER — Encounter: Payer: Self-pay | Admitting: Orthopedic Surgery

## 2022-10-26 ENCOUNTER — Encounter: Payer: Self-pay | Admitting: Family

## 2022-10-26 DIAGNOSIS — T8149XA Infection following a procedure, other surgical site, initial encounter: Secondary | ICD-10-CM

## 2022-10-26 NOTE — Progress Notes (Signed)
Post-Op Visit Note   Patient: Patricia Adkins           Date of Birth: 11/03/1970           MRN: [id number] Visit Date: 10/26/2022 PCP: Aura Dials, MD  Chief Complaint:  Chief Complaint  Patient presents with   Right Hip - Routine Post Op    10/12/2022 right hip debridement     HPI:  HPI The patient is a 52 year old woman seen status post debridement of the right hip for an abscess bursa right hip.  She is status post open repair of gluteus muscle on September 8 went on to have debridement November 22.  As well as debridement with Dr. Sharol Given on January 3.  Wound VAC has been removed.  Since wound VAC has been removed she has gone on to have 2 episodes of large volume of serosanguineous drainage from her wound which has been quite distressing.  She also has some bruising around the incision  Is currently on doxycycline  Ortho Exam On examination of the incision this is well-approximated with sutures is nearly fully healed there are 2 remaining areas which continue to have serosanguineous drainage.  Proximally this area was explored with a silver nitrate stick and probes 3 mm deep there is no tunneling.  There is no area of palpable fluctuance no erythema no warmth she does have scattered ecchymoses.   Visit Diagnoses: No diagnosis found.  Plan: Reassurance provided.  Discussed course of seromas.  No sign of infection today.  She will continue on her doxycycline.  Continue with daily dial soap cleansing. Dry dressings.  Discussed return precautions.  Return for any worsening swelling warmth purulence odor  Follow-Up Instructions: No follow-ups on file.   Imaging: No results found.  Orders:  No orders of the defined types were placed in this encounter.  No orders of the defined types were placed in this encounter.    PMFS History: Patient Active Problem List   Diagnosis Date Noted   Palpitations 10/21/2022   Abnormal EKG 10/21/2022   Abscess of bursa of right hip  10/12/2022   Gastrointestinal complaints 07/27/2022   Chronic rhinitis 07/27/2022   Past Medical History:  Diagnosis Date   Anemia    Anxiety    Atypical mole 08/03/2006   Left Abdomen (marked) (excision)   Atypical mole 08/03/2006   Lower Back (marked) (excision)   Atypical mole 05/23/2012   Left Low Back (moderate) (wider shave)   Atypical mole 06/28/2012   Left Paraspinal (moderate) (wider shave)   Atypical mole 06/28/2012   Right Lateral Back (mild)   Atypical mole 06/28/2012   Right Upper Buttock (severe) (wider shave)   Atypical mole 06/28/2012   Left Upper Arm (severe) (wider shave)   Atypical mole 07/19/2013   Left Post Shoulder Superior (moderate)   Atypical mole 07/19/2013   Left Post Shoulder Inferior (mild)   Atypical mole 07/19/2013   Right Migh Thigh (moderate)   Atypical mole 05/30/2014   Left Chin (mild)   Atypical mole 05/30/2014   Lower Abdomen (moderate)   Atypical mole 05/30/2014   Left Tricept (unsual spitz tumor) (wider shave)   Atypical mole 10/27/2015   Left Thigh (moderate)   Atypical mole 10/27/2015   Supra Pubic (moderate)   Atypical mole 06/07/2016   Right Post Shoulder (moderate)   Atypical mole 11/06/2017   Right Upper Abdomen (moderate)   Atypical mole 08/01/2018   Left Thigh (moderate) (wider shave)   Atypical mole  08/01/2018   Right Forearm (severe) (excision)   Atypical mole 06/26/2019   Right Outer Mid Back (severe) (wider shave)   Bipolar 2 disorder ([facility])    (Dr. Toy Care)- Started after second pregnancy   Chronic kidney disease    Depression    Gastritis    Hypothyroidism    Lumbar degenerative disc disease    Migraine    headaches (Dr. Catalina Gravel)   Mitral valve prolapse    Pneumonia    PONV (postoperative nausea and vomiting)    Thyroid disease     Family History  Problem Relation Age of Onset   Allergic rhinitis Mother    Arthritis Mother    Hypertension Father        at a young age,    Hyperlipidemia Father     Allergic rhinitis Sister    Hypertension Sister    Asthma Son     Past Surgical History:  Procedure Laterality Date   ABDOMINAL HYSTERECTOMY     CESAREAN SECTION  10/10/1996   CYSTECTOMY     Uterine   DILATION AND CURETTAGE OF UTERUS     I & D EXTREMITY Right 10/12/2022   Procedure: RIGHT HIP DEBRIDEMENT;  Surgeon: Newt Minion, MD;  Location: [facility];  Service: Orthopedics;  Laterality: Right;   INCISION AND DRAINAGE OF WOUND Right 08/31/2022   Procedure: IRRIGATION AND DEBRIDEMENT HIP WOUND;  Surgeon: Nicholes Stairs, MD;  Location: [facility];  Service: Orthopedics;  Laterality: Right;  60 ok per OR   OPEN SURGICAL REPAIR OF GLUTEAL TENDON Right 06/17/2022   Procedure: RIGHT HIP OPEN REPAIR OF GLUTEAL MEDIUS;  Surgeon: Nicholes Stairs, MD;  [facility];  Service: Orthopedics;  Laterality: Right;  120   PLANTAR FASCIA SURGERY Right    US ECHOCARDIOGRAPHY     Normal EF, mild MR/TR, Normal LV size and function. There were no regional wall motion abnormailities. Left Ventricular ejection fraction estimated by 2D at 60-65%. Mild mitral valve regurgitation. Mild tricuspid regurgitation   Social History   Occupational History   Not on file  Tobacco Use   Smoking status: Never   Smokeless tobacco: Never  Vaping Use   Vaping Use: Never used  Substance and Sexual Activity   Alcohol use: No   Drug use: No   Sexual activity: Not on file

## 2022-10-28 ENCOUNTER — Encounter: Payer: Self-pay | Admitting: Orthopedic Surgery

## 2022-10-28 NOTE — Telephone Encounter (Signed)
I called and spoke with patient.

## 2022-10-30 ENCOUNTER — Encounter: Payer: Self-pay | Admitting: Orthopedic Surgery

## 2022-10-30 ENCOUNTER — Encounter: Payer: Self-pay | Admitting: Cardiology

## 2022-10-31 ENCOUNTER — Ambulatory Visit: Payer: BC Managed Care – PPO | Admitting: Podiatry

## 2022-10-31 NOTE — Telephone Encounter (Signed)
I will follow up.  Thanks MJP

## 2022-10-31 NOTE — Telephone Encounter (Signed)
From patient

## 2022-11-01 ENCOUNTER — Ambulatory Visit (INDEPENDENT_AMBULATORY_CARE_PROVIDER_SITE_OTHER): Payer: BC Managed Care – PPO | Admitting: Orthopedic Surgery

## 2022-11-01 DIAGNOSIS — T8149XA Infection following a procedure, other surgical site, initial encounter: Secondary | ICD-10-CM

## 2022-11-02 ENCOUNTER — Encounter: Payer: Self-pay | Admitting: Orthopedic Surgery

## 2022-11-02 NOTE — Progress Notes (Signed)
Office Visit Note   Patient: Patricia Adkins           Date of Birth: 27-Jun-1971           MRN: [id number] Visit Date: 11/01/2022              Requested by: Aura Dials, [address],  [place] [id number] PCP: Aura Dials, MD  Chief Complaint  Patient presents with   Right Hip - Routine Post Op    10/12/2022 right hip debridement      HPI: Patient is a 52 year old woman who presents 3 weeks status post debridement right hip with removal of retained anchors and retained braided suture.  She is on doxycycline and probiotics.  Assessment & Plan: Visit Diagnoses:  1. Postoperative wound infection of right hip     Plan: Recommended Dial soap cleansing dry dressing change and wick the wound open.  Patient is given a prescription for physical therapy at [facility].  Follow-Up Instructions: Return in about 2 weeks (around 11/15/2022).   Ortho Exam  Patient is alert, oriented, no adenopathy, well-dressed, normal affect, normal respiratory effort. Examination the wound is flat and healed well proximally and area that she had a blister has broken down she now has an open wound that is 3 mm in diameter proximally with clear serosanguineous drainage.  There is no purulence no cellulitis.  The incision is well-healed.  Will harvest sutures today.  Imaging: No results found.   Labs: Lab Results  Component Value Date   REPTSTATUS 10/17/2022 FINAL 10/12/2022   GRAMSTAIN NO WBC SEEN NO ORGANISMS SEEN  10/12/2022   CULT  10/12/2022    No growth aerobically or anaerobically. Performed at [facility], [place] [address], [place],  [address]      No results found for: "ALBUMIN", "PREALBUMIN", "CBC"  Lab Results  Component Value Date   MG 1.8 10/19/2022   No results found for: "VD25OH"  No results found for: "PREALBUMIN"    Latest Ref Rng & Units 10/19/2022   10:06 AM 10/12/2022   11:10 AM 08/31/2022    2:52 PM  CBC EXTENDED  WBC 4.0 - 10.5 K/uL 5.4   3.6  6.0   RBC 3.87 - 5.11 MIL/uL 3.79  4.39  4.63   Hemoglobin 12.0 - 15.0 g/dL 10.9  13.0  13.6   HCT 36.0 - 46.0 % 33.0  39.0  41.0   Platelets 150 - 400 K/uL 200  200  231   NEUT# 1.7 - 7.7 K/uL 4.2     Lymph# 0.7 - 4.0 K/uL 0.6        There is no height or weight on file to calculate BMI.  Orders:  No orders of the defined types were placed in this encounter.  No orders of the defined types were placed in this encounter.    Procedures: No procedures performed  Clinical Data: No additional findings.  ROS:  All other systems negative, except as noted in the HPI. Review of Systems  Objective: Vital Signs: There were no vitals taken for this visit.  Specialty Comments:  No specialty comments available.  PMFS History: Patient Active Problem List   Diagnosis Date Noted   Palpitations 10/21/2022   Abnormal EKG 10/21/2022   Abscess of bursa of right hip 10/12/2022   Gastrointestinal complaints 07/27/2022   Chronic rhinitis 07/27/2022   Past Medical History:  Diagnosis Date   Anemia    Anxiety  Atypical mole 08/03/2006   Left Abdomen (marked) (excision)   Atypical mole 08/03/2006   Lower Back (marked) (excision)   Atypical mole 05/23/2012   Left Low Back (moderate) (wider shave)   Atypical mole 06/28/2012   Left Paraspinal (moderate) (wider shave)   Atypical mole 06/28/2012   Right Lateral Back (mild)   Atypical mole 06/28/2012   Right Upper Buttock (severe) (wider shave)   Atypical mole 06/28/2012   Left Upper Arm (severe) (wider shave)   Atypical mole 07/19/2013   Left Post Shoulder Superior (moderate)   Atypical mole 07/19/2013   Left Post Shoulder Inferior (mild)   Atypical mole 07/19/2013   Right Migh Thigh (moderate)   Atypical mole 05/30/2014   Left Chin (mild)   Atypical mole 05/30/2014   Lower Abdomen (moderate)   Atypical mole 05/30/2014   Left Tricept (unsual spitz tumor) (wider shave)   Atypical mole 10/27/2015   Left Thigh  (moderate)   Atypical mole 10/27/2015   Supra Pubic (moderate)   Atypical mole 06/07/2016   Right Post Shoulder (moderate)   Atypical mole 11/06/2017   Right Upper Abdomen (moderate)   Atypical mole 08/01/2018   Left Thigh (moderate) (wider shave)   Atypical mole 08/01/2018   Right Forearm (severe) (excision)   Atypical mole 06/26/2019   Right Outer Mid Back (severe) (wider shave)   Bipolar 2 disorder ([facility])    (Dr. Toy Care)- Started after second pregnancy   Chronic kidney disease    Depression    Gastritis    Hypothyroidism    Lumbar degenerative disc disease    Migraine    headaches (Dr. Catalina Gravel)   Mitral valve prolapse    Pneumonia    PONV (postoperative nausea and vomiting)    Thyroid disease     Family History  Problem Relation Age of Onset   Allergic rhinitis Mother    Arthritis Mother    Hypertension Father        at a young age,    Hyperlipidemia Father    Allergic rhinitis Sister    Hypertension Sister    Asthma Son     Past Surgical History:  Procedure Laterality Date   ABDOMINAL HYSTERECTOMY     CESAREAN SECTION  10/10/1996   CYSTECTOMY     Uterine   DILATION AND CURETTAGE OF UTERUS     I & D EXTREMITY Right 10/12/2022   Procedure: RIGHT HIP DEBRIDEMENT;  Surgeon: Newt Minion, MD;  Location: [facility];  Service: Orthopedics;  Laterality: Right;   INCISION AND DRAINAGE OF WOUND Right 08/31/2022   Procedure: IRRIGATION AND DEBRIDEMENT HIP WOUND;  Surgeon: Nicholes Stairs, MD;  Location: [facility];  Service: Orthopedics;  Laterality: Right;  60 ok per OR   OPEN SURGICAL REPAIR OF GLUTEAL TENDON Right 06/17/2022   Procedure: RIGHT HIP OPEN REPAIR OF GLUTEAL MEDIUS;  Surgeon: Nicholes Stairs, MD;  Location[facility];  Service: Orthopedics;  Laterality: Right;  120   PLANTAR FASCIA SURGERY Right    US ECHOCARDIOGRAPHY     Normal EF, mild MR/TR, Normal LV size and function. There were no regional wall motion abnormailities. Left Ventricular ejection fraction  estimated by 2D at 60-65%. Mild mitral valve regurgitation. Mild tricuspid regurgitation   Social History   Occupational History   Not on file  Tobacco Use   Smoking status: Never   Smokeless tobacco: Never  Vaping Use   Vaping Use: Never used  Substance and Sexual Activity   Alcohol use: No  Drug use: No   Sexual activity: Not on file

## 2022-11-03 ENCOUNTER — Encounter: Payer: Self-pay | Admitting: Orthopedic Surgery

## 2022-11-07 ENCOUNTER — Ambulatory Visit: Payer: BC Managed Care – PPO

## 2022-11-07 DIAGNOSIS — R9431 Abnormal electrocardiogram [ECG] [EKG]: Secondary | ICD-10-CM

## 2022-11-08 ENCOUNTER — Ambulatory Visit (INDEPENDENT_AMBULATORY_CARE_PROVIDER_SITE_OTHER): Payer: BC Managed Care – PPO | Admitting: Orthopedic Surgery

## 2022-11-08 ENCOUNTER — Encounter: Payer: Self-pay | Admitting: Orthopedic Surgery

## 2022-11-08 DIAGNOSIS — T8149XA Infection following a procedure, other surgical site, initial encounter: Secondary | ICD-10-CM

## 2022-11-08 NOTE — Progress Notes (Signed)
Office Visit Note   Patient: Patricia Adkins           Date of Birth: 30-May-1971           MRN: [id number] Visit Date: 11/08/2022              Requested by: Aura Dials, [place],  [place] [id number] PCP: Aura Dials, MD  Chief Complaint  Patient presents with   Right Hip - Routine Post Op    10/12/2022 right hip debridemen      HPI: Patient is a 52 year old woman who presents 3 weeks status post debridement right hip on January 3.  She is currently on doxycycline.  Assessment & Plan: Visit Diagnoses:  1. Postoperative wound infection of right hip     Plan: Patient will complete her antibiotics.  She will continue with Dial soap cleansing with 4 x 4 dressing changes.  Follow-Up Instructions: Return in about 2 weeks (around 11/22/2022).   Ortho Exam  Patient is alert, oriented, no adenopathy, well-dressed, normal affect, normal respiratory effort. Examination there is no redness or cellulitis.  She has a wound proximally there is 3 mm in diameter and a wound distally that is 1 mm in diameter.  The skin incision is healed well it is flat there is no cellulitis no keloid formation.  With palpation there is clear serosanguineous drainage that is draining from the 2 small wounds.  There is no purulence no signs of infection.  Imaging: No results found.   Labs: Lab Results  Component Value Date   REPTSTATUS 10/17/2022 FINAL 10/12/2022   GRAMSTAIN NO WBC SEEN NO ORGANISMS SEEN  10/12/2022   CULT  10/12/2022    No growth aerobically or anaerobically. Performed at [facility], [place] [address], [place],  [address]      No results found for: "ALBUMIN", "PREALBUMIN", "CBC"  Lab Results  Component Value Date   MG 1.8 10/19/2022   No results found for: "VD25OH"  No results found for: "PREALBUMIN"    Latest Ref Rng & Units 10/19/2022   10:06 AM 10/12/2022   11:10 AM 08/31/2022    2:52 PM  CBC EXTENDED  WBC 4.0 - 10.5 K/uL 5.4  3.6  6.0    RBC 3.87 - 5.11 MIL/uL 3.79  4.39  4.63   Hemoglobin 12.0 - 15.0 g/dL 10.9  13.0  13.6   HCT 36.0 - 46.0 % 33.0  39.0  41.0   Platelets 150 - 400 K/uL 200  200  231   NEUT# 1.7 - 7.7 K/uL 4.2     Lymph# 0.7 - 4.0 K/uL 0.6        There is no height or weight on file to calculate BMI.  Orders:  No orders of the defined types were placed in this encounter.  No orders of the defined types were placed in this encounter.    Procedures: No procedures performed  Clinical Data: No additional findings.  ROS:  All other systems negative, except as noted in the HPI. Review of Systems  Objective: Vital Signs: There were no vitals taken for this visit.  Specialty Comments:  No specialty comments available.  PMFS History: Patient Active Problem List   Diagnosis Date Noted   Palpitations 10/21/2022   Abnormal EKG 10/21/2022   Abscess of bursa of right hip 10/12/2022   Gastrointestinal complaints 07/27/2022   Chronic rhinitis 07/27/2022   Past Medical History:  Diagnosis Date  Anemia    Anxiety    Atypical mole 08/03/2006   Left Abdomen (marked) (excision)   Atypical mole 08/03/2006   Lower Back (marked) (excision)   Atypical mole 05/23/2012   Left Low Back (moderate) (wider shave)   Atypical mole 06/28/2012   Left Paraspinal (moderate) (wider shave)   Atypical mole 06/28/2012   Right Lateral Back (mild)   Atypical mole 06/28/2012   Right Upper Buttock (severe) (wider shave)   Atypical mole 06/28/2012   Left Upper Arm (severe) (wider shave)   Atypical mole 07/19/2013   Left Post Shoulder Superior (moderate)   Atypical mole 07/19/2013   Left Post Shoulder Inferior (mild)   Atypical mole 07/19/2013   Right Migh Thigh (moderate)   Atypical mole 05/30/2014   Left Chin (mild)   Atypical mole 05/30/2014   Lower Abdomen (moderate)   Atypical mole 05/30/2014   Left Tricept (unsual spitz tumor) (wider shave)   Atypical mole 10/27/2015   Left Thigh (moderate)    Atypical mole 10/27/2015   Supra Pubic (moderate)   Atypical mole 06/07/2016   Right Post Shoulder (moderate)   Atypical mole 11/06/2017   Right Upper Abdomen (moderate)   Atypical mole 08/01/2018   Left Thigh (moderate) (wider shave)   Atypical mole 08/01/2018   Right Forearm (severe) (excision)   Atypical mole 06/26/2019   Right Outer Mid Back (severe) (wider shave)   Bipolar 2 disorder (HCC)    (Dr. Toy Care)- Started after second pregnancy   Chronic kidney disease    Depression    Gastritis    Hypothyroidism    Lumbar degenerative disc disease    Migraine    headaches (Dr. Catalina Gravel)   Mitral valve prolapse    Pneumonia    PONV (postoperative nausea and vomiting)    Thyroid disease     Family History  Problem Relation Age of Onset   Allergic rhinitis Mother    Arthritis Mother    Hypertension Father        at a young age,    Hyperlipidemia Father    Allergic rhinitis Sister    Hypertension Sister    Asthma Son     Past Surgical History:  Procedure Laterality Date   ABDOMINAL HYSTERECTOMY     CESAREAN SECTION  10/10/1996   CYSTECTOMY     Uterine   DILATION AND CURETTAGE OF UTERUS     I & D EXTREMITY Right 10/12/2022   Procedure: RIGHT HIP DEBRIDEMENT;  Surgeon: Newt Minion, MD;  Location: [facility];  Service: Orthopedics;  Laterality: Right;   INCISION AND DRAINAGE OF WOUND Right 08/31/2022   Procedure: IRRIGATION AND DEBRIDEMENT HIP WOUND;  Surgeon: Nicholes Stairs, MD;  Location: [facility];  Service: Orthopedics;  Laterality: Right;  60 ok per OR   OPEN SURGICAL REPAIR OF GLUTEAL TENDON Right 06/17/2022   Procedure: RIGHT HIP OPEN REPAIR OF GLUTEAL MEDIUS;  Surgeon: Nicholes Stairs, MD;  Location: [facility];  Service: Orthopedics;  Laterality: Right;  120   PLANTAR FASCIA SURGERY Right    US ECHOCARDIOGRAPHY     Normal EF, mild MR/TR, Normal LV size and function. There were no regional wall motion abnormailities. Left Ventricular ejection fraction estimated by 2D at  60-65%. Mild mitral valve regurgitation. Mild tricuspid regurgitation   Social History   Occupational History   Not on file  Tobacco Use   Smoking status: Never   Smokeless tobacco: Never  Vaping Use   Vaping Use: Never used  Substance  and Sexual Activity   Alcohol use: No   Drug use: No   Sexual activity: Not on file

## 2022-11-10 ENCOUNTER — Encounter: Payer: Self-pay | Admitting: Psychiatry

## 2022-11-10 NOTE — Telephone Encounter (Signed)
OK to cancel her appt, thanks

## 2022-11-11 DIAGNOSIS — R110 Nausea: Secondary | ICD-10-CM | POA: Diagnosis not present

## 2022-11-11 DIAGNOSIS — R1084 Generalized abdominal pain: Secondary | ICD-10-CM | POA: Diagnosis not present

## 2022-11-11 DIAGNOSIS — K6389 Other specified diseases of intestine: Secondary | ICD-10-CM | POA: Diagnosis not present

## 2022-11-11 DIAGNOSIS — R197 Diarrhea, unspecified: Secondary | ICD-10-CM | POA: Diagnosis not present

## 2022-11-14 DIAGNOSIS — R109 Unspecified abdominal pain: Secondary | ICD-10-CM | POA: Diagnosis not present

## 2022-11-14 DIAGNOSIS — R197 Diarrhea, unspecified: Secondary | ICD-10-CM | POA: Diagnosis not present

## 2022-11-15 ENCOUNTER — Encounter: Payer: BC Managed Care – PPO | Admitting: Orthopedic Surgery

## 2022-11-15 DIAGNOSIS — Z4789 Encounter for other orthopedic aftercare: Secondary | ICD-10-CM | POA: Diagnosis not present

## 2022-11-15 DIAGNOSIS — M25551 Pain in right hip: Secondary | ICD-10-CM | POA: Diagnosis not present

## 2022-11-17 ENCOUNTER — Other Ambulatory Visit: Payer: Self-pay | Admitting: Orthopedic Surgery

## 2022-11-17 DIAGNOSIS — M25551 Pain in right hip: Secondary | ICD-10-CM | POA: Diagnosis not present

## 2022-11-17 DIAGNOSIS — Z4789 Encounter for other orthopedic aftercare: Secondary | ICD-10-CM | POA: Diagnosis not present

## 2022-11-21 ENCOUNTER — Ambulatory Visit (INDEPENDENT_AMBULATORY_CARE_PROVIDER_SITE_OTHER): Payer: BC Managed Care – PPO | Admitting: Orthopedic Surgery

## 2022-11-21 DIAGNOSIS — T8149XA Infection following a procedure, other surgical site, initial encounter: Secondary | ICD-10-CM

## 2022-11-22 DIAGNOSIS — Z4789 Encounter for other orthopedic aftercare: Secondary | ICD-10-CM | POA: Diagnosis not present

## 2022-11-22 DIAGNOSIS — M25551 Pain in right hip: Secondary | ICD-10-CM | POA: Diagnosis not present

## 2022-11-25 ENCOUNTER — Encounter: Payer: Self-pay | Admitting: Cardiology

## 2022-11-25 ENCOUNTER — Encounter: Payer: Self-pay | Admitting: Orthopedic Surgery

## 2022-11-25 DIAGNOSIS — R002 Palpitations: Secondary | ICD-10-CM | POA: Diagnosis not present

## 2022-11-25 DIAGNOSIS — M25551 Pain in right hip: Secondary | ICD-10-CM | POA: Diagnosis not present

## 2022-11-25 DIAGNOSIS — Z4789 Encounter for other orthopedic aftercare: Secondary | ICD-10-CM | POA: Diagnosis not present

## 2022-11-25 NOTE — Progress Notes (Signed)
Office Visit Note   Patient: Patricia Adkins           Date of Birth: [birth year]           MRN: [id number] Visit Date: 11/21/2022              Requested by: Aura Dials, [place],  [place] [address] PCP: Aura Dials, MD  Chief Complaint  Patient presents with   Right Hip - Routine Post Op    10/12/2022 right hip debridement      HPI: Patient is a 52 year old woman who presents 5 weeks status post debridement right hip.  Patient will complete her course of doxycycline.  Assessment & Plan: Visit Diagnoses:  1. Postoperative wound infection of right hip     Plan: Patient will continue with physical therapy for strengthening.  Follow-Up Instructions: Return if symptoms worsen or fail to improve.   Ortho Exam  Patient is alert, oriented, no adenopathy, well-dressed, normal affect, normal respiratory effort. Examination the incision is healed there is no drainage no tenderness to palpation no cellulitis.  Imaging: No results found.   Labs: Lab Results  Component Value Date   REPTSTATUS 10/17/2022 FINAL 10/12/2022   GRAMSTAIN NO WBC SEEN NO ORGANISMS SEEN  10/12/2022   CULT  10/12/2022    No growth aerobically or anaerobically. Performed at [facility], [place] [address], [place], [place] [address]      No results found for: "ALBUMIN", "PREALBUMIN", "CBC"  Lab Results  Component Value Date   MG 1.8 10/19/2022   No results found for: "VD25OH"  No results found for: "PREALBUMIN"    Latest Ref Rng & Units 10/19/2022   10:06 AM 10/12/2022   11:10 AM 08/31/2022    2:52 PM  CBC EXTENDED  WBC 4.0 - 10.5 K/uL 5.4  3.6  6.0   RBC 3.87 - 5.11 MIL/uL 3.79  4.39  4.63   Hemoglobin 12.0 - 15.0 g/dL 10.9  13.0  13.6   HCT 36.0 - 46.0 % 33.0  39.0  41.0   Platelets 150 - 400 K/uL 200  200  231   NEUT# 1.7 - 7.7 K/uL 4.2     Lymph# 0.7 - 4.0 K/uL 0.6        There is no height or weight on file to calculate BMI.  Orders:  No orders of the  defined types were placed in this encounter.  No orders of the defined types were placed in this encounter.    Procedures: No procedures performed  Clinical Data: No additional findings.  ROS:  All other systems negative, except as noted in the HPI. Review of Systems  Objective: Vital Signs: There were no vitals taken for this visit.  Specialty Comments:  No specialty comments available.  PMFS History: Patient Active Problem List   Diagnosis Date Noted   Palpitations 10/21/2022   Abnormal EKG 10/21/2022   Abscess of bursa of right hip 10/12/2022   Gastrointestinal complaints 07/27/2022   Chronic rhinitis 07/27/2022   Past Medical History:  Diagnosis Date   Anemia    Anxiety    Atypical mole 08/03/2006   Left Abdomen (marked) (excision)   Atypical mole 08/03/2006   Lower Back (marked) (excision)   Atypical mole 05/23/2012   Left Low Back (moderate) (wider shave)   Atypical mole 06/28/2012   Left Paraspinal (moderate) (wider shave)   Atypical mole 06/28/2012   Right Lateral Back (mild)   Atypical  mole 06/28/2012   Right Upper Buttock (severe) (wider shave)   Atypical mole 06/28/2012   Left Upper Arm (severe) (wider shave)   Atypical mole 07/19/2013   Left Post Shoulder Superior (moderate)   Atypical mole 07/19/2013   Left Post Shoulder Inferior (mild)   Atypical mole 07/19/2013   Right Migh Thigh (moderate)   Atypical mole 05/30/2014   Left Chin (mild)   Atypical mole 05/30/2014   Lower Abdomen (moderate)   Atypical mole 05/30/2014   Left Tricept (unsual spitz tumor) (wider shave)   Atypical mole 10/27/2015   Left Thigh (moderate)   Atypical mole 10/27/2015   Supra Pubic (moderate)   Atypical mole 06/07/2016   Right Post Shoulder (moderate)   Atypical mole 11/06/2017   Right Upper Abdomen (moderate)   Atypical mole 08/01/2018   Left Thigh (moderate) (wider shave)   Atypical mole 08/01/2018   Right Forearm (severe) (excision)   Atypical mole  06/26/2019   Right Outer Mid Back (severe) (wider shave)   Bipolar 2 disorder (HCC)    (Dr. Toy Care)- Started after second pregnancy   Chronic kidney disease    Depression    Gastritis    Hypothyroidism    Lumbar degenerative disc disease    Migraine    headaches (Dr. Catalina Gravel)   Mitral valve prolapse    Pneumonia    PONV (postoperative nausea and vomiting)    Thyroid disease     Family History  Problem Relation Age of Onset   Allergic rhinitis Mother    Arthritis Mother    Hypertension Father        at a young age,    Hyperlipidemia Father    Allergic rhinitis Sister    Hypertension Sister    Asthma Son     Past Surgical History:  Procedure Laterality Date   ABDOMINAL HYSTERECTOMY     CESAREAN SECTION  10/10/1996   CYSTECTOMY     Uterine   DILATION AND CURETTAGE OF UTERUS     I & D EXTREMITY Right 10/12/2022   Procedure: RIGHT HIP DEBRIDEMENT;  Surgeon: Newt Minion, MD;  Location: [facility];  Service: Orthopedics;  Laterality: Right;   INCISION AND DRAINAGE OF WOUND Right 08/31/2022   Procedure: IRRIGATION AND DEBRIDEMENT HIP WOUND;  Surgeon: Nicholes Stairs, MD;  Location: [facility];  Service: Orthopedics;  Laterality: Right;  60 ok per OR   OPEN SURGICAL REPAIR OF GLUTEAL TENDON Right 06/17/2022   Procedure: RIGHT HIP OPEN REPAIR OF GLUTEAL MEDIUS;  Surgeon: Nicholes Stairs, MD;  Location: [facility];  Service: Orthopedics;  Laterality: Right;  120   PLANTAR FASCIA SURGERY Right    US ECHOCARDIOGRAPHY     Normal EF, mild MR/TR, Normal LV size and function. There were no regional wall motion abnormailities. Left Ventricular ejection fraction estimated by 2D at 60-65%. Mild mitral valve regurgitation. Mild tricuspid regurgitation   Social History   Occupational History   Not on file  Tobacco Use   Smoking status: Never   Smokeless tobacco: Never  Vaping Use   Vaping Use: Never used  Substance and Sexual Activity   Alcohol use: No   Drug use: No   Sexual activity:  Not on file

## 2022-11-25 NOTE — Telephone Encounter (Signed)
From patient

## 2022-11-26 ENCOUNTER — Encounter: Payer: Self-pay | Admitting: Orthopedic Surgery

## 2022-11-28 ENCOUNTER — Encounter: Payer: Self-pay | Admitting: Orthopedic Surgery

## 2022-11-28 ENCOUNTER — Ambulatory Visit (INDEPENDENT_AMBULATORY_CARE_PROVIDER_SITE_OTHER): Payer: BC Managed Care – PPO | Admitting: Orthopedic Surgery

## 2022-11-28 DIAGNOSIS — T8149XA Infection following a procedure, other surgical site, initial encounter: Secondary | ICD-10-CM

## 2022-11-28 NOTE — Telephone Encounter (Signed)
Offered appt for this afternoon or tomorrow afternoon for further eval. Waiting for pt reply in another mychart message she sent today.

## 2022-11-29 ENCOUNTER — Encounter: Payer: Self-pay | Admitting: Orthopedic Surgery

## 2022-11-29 NOTE — Progress Notes (Signed)
Office Visit Note   Patient: Patricia Adkins           Date of Birth: [birth year]           MRN: [id number] Visit Date: 11/28/2022              Requested by: Aura Dials, [place],  [place] [address] PCP: Aura Dials, MD  Chief Complaint  Patient presents with   Right Hip - Routine Post Op    10/12/2022 right hip debridement       HPI: Patient is a 52 year old woman who is seen in follow-up status post debridement right hip she is about 6 weeks out.  Patient states she has had episodes of pain 8 out of 10 with weightbearing.  She states began Friday was going up steps with pain radiating along her iliotibial band.  Patient states she still has a little bit of drainage from the incision.  Patient states that she was having shooting pain down her iliotibial band but no shooting pain today.  Assessment & Plan: Visit Diagnoses: No diagnosis found.  Plan: Recommended hamstring strengthening with physical therapy.  Symptoms seem consistent with the iliotibial band compensating for week hamstring and gluteus muscles.  Follow-Up Instructions: Return in about 4 weeks (around 12/26/2022).   Ortho Exam  Patient is alert, oriented, no adenopathy, well-dressed, normal affect, normal respiratory effort. Examination patient has a negative straight leg raise motor strength is 5/5 no signs or symptoms of radicular pain.  Her pain was over her iliotibial band.  Examination the incision shows there is some thin serosanguineous drainage but no cellulitis no purulent drainage.  The incision is flat.  Imaging: No results found. No images are attached to the encounter.  Labs: Lab Results  Component Value Date   REPTSTATUS 10/17/2022 FINAL 10/12/2022   GRAMSTAIN NO WBC SEEN NO ORGANISMS SEEN  10/12/2022   CULT  10/12/2022    No growth aerobically or anaerobically. Performed at [facility], [place] [address], [place], [place] [address]      No results found for:  "ALBUMIN", "PREALBUMIN", "CBC"  Lab Results  Component Value Date   MG 1.8 10/19/2022   No results found for: "VD25OH"  No results found for: "PREALBUMIN"    Latest Ref Rng & Units 10/19/2022   10:06 AM 10/12/2022   11:10 AM 08/31/2022    2:52 PM  CBC EXTENDED  WBC 4.0 - 10.5 K/uL 5.4  3.6  6.0   RBC 3.87 - 5.11 MIL/uL 3.79  4.39  4.63   Hemoglobin 12.0 - 15.0 g/dL 10.9  13.0  13.6   HCT 36.0 - 46.0 % 33.0  39.0  41.0   Platelets 150 - 400 K/uL 200  200  231   NEUT# 1.7 - 7.7 K/uL 4.2     Lymph# 0.7 - 4.0 K/uL 0.6        There is no height or weight on file to calculate BMI.  Orders:  No orders of the defined types were placed in this encounter.  No orders of the defined types were placed in this encounter.    Procedures: No procedures performed  Clinical Data: No additional findings.  ROS:  All other systems negative, except as noted in the HPI. Review of Systems  Objective: Vital Signs: There were no vitals taken for this visit.  Specialty Comments:  No specialty comments available.  PMFS History: Patient Active Problem List   Diagnosis  Date Noted   Palpitations 10/21/2022   Abnormal EKG 10/21/2022   Abscess of bursa of right hip 10/12/2022   Gastrointestinal complaints 07/27/2022   Chronic rhinitis 07/27/2022   Past Medical History:  Diagnosis Date   Anemia    Anxiety    Atypical mole 08/03/2006   Left Abdomen (marked) (excision)   Atypical mole 08/03/2006   Lower Back (marked) (excision)   Atypical mole 05/23/2012   Left Low Back (moderate) (wider shave)   Atypical mole 06/28/2012   Left Paraspinal (moderate) (wider shave)   Atypical mole 06/28/2012   Right Lateral Back (mild)   Atypical mole 06/28/2012   Right Upper Buttock (severe) (wider shave)   Atypical mole 06/28/2012   Left Upper Arm (severe) (wider shave)   Atypical mole 07/19/2013   Left Post Shoulder Superior (moderate)   Atypical mole 07/19/2013   Left Post Shoulder Inferior  (mild)   Atypical mole 07/19/2013   Right Migh Thigh (moderate)   Atypical mole 05/30/2014   Left Chin (mild)   Atypical mole 05/30/2014   Lower Abdomen (moderate)   Atypical mole 05/30/2014   Left Tricept (unsual spitz tumor) (wider shave)   Atypical mole 10/27/2015   Left Thigh (moderate)   Atypical mole 10/27/2015   Supra Pubic (moderate)   Atypical mole 06/07/2016   Right Post Shoulder (moderate)   Atypical mole 11/06/2017   Right Upper Abdomen (moderate)   Atypical mole 08/01/2018   Left Thigh (moderate) (wider shave)   Atypical mole 08/01/2018   Right Forearm (severe) (excision)   Atypical mole 06/26/2019   Right Outer Mid Back (severe) (wider shave)   Bipolar 2 disorder (HCC)    (Dr. Toy Care)- Started after second pregnancy   Chronic kidney disease    Depression    Gastritis    Hypothyroidism    Lumbar degenerative disc disease    Migraine    headaches (Dr. Catalina Gravel)   Mitral valve prolapse    Pneumonia    PONV (postoperative nausea and vomiting)    Thyroid disease     Family History  Problem Relation Age of Onset   Allergic rhinitis Mother    Arthritis Mother    Hypertension Father        at a young age,    Hyperlipidemia Father    Allergic rhinitis Sister    Hypertension Sister    Asthma Son     Past Surgical History:  Procedure Laterality Date   ABDOMINAL HYSTERECTOMY     CESAREAN SECTION  10/10/1996   CYSTECTOMY     Uterine   DILATION AND CURETTAGE OF UTERUS     I & D EXTREMITY Right 10/12/2022   Procedure: RIGHT HIP DEBRIDEMENT;  Surgeon: Newt Minion, MD;  Location: [facility];  Service: Orthopedics;  Laterality: Right;   INCISION AND DRAINAGE OF WOUND Right 08/31/2022   Procedure: IRRIGATION AND DEBRIDEMENT HIP WOUND;  Surgeon: Nicholes Stairs, MD;  Location: [facility];  Service: Orthopedics;  Laterality: Right;  60 ok per OR   OPEN SURGICAL REPAIR OF GLUTEAL TENDON Right 06/17/2022   Procedure: RIGHT HIP OPEN REPAIR OF GLUTEAL MEDIUS;  Surgeon: Nicholes Stairs, MD;  Location[facility];  Service: Orthopedics;  Laterality: Right;  120   PLANTAR FASCIA SURGERY Right    US ECHOCARDIOGRAPHY     Normal EF, mild MR/TR, Normal LV size and function. There were no regional wall motion abnormailities. Left Ventricular ejection fraction estimated by 2D at 60-65%. Mild mitral valve regurgitation.  Mild tricuspid regurgitation   Social History   Occupational History   Not on file  Tobacco Use   Smoking status: Never   Smokeless tobacco: Never  Vaping Use   Vaping Use: Never used  Substance and Sexual Activity   Alcohol use: No   Drug use: No   Sexual activity: Not on file

## 2022-12-02 DIAGNOSIS — Z4789 Encounter for other orthopedic aftercare: Secondary | ICD-10-CM | POA: Diagnosis not present

## 2022-12-02 DIAGNOSIS — M25551 Pain in right hip: Secondary | ICD-10-CM | POA: Diagnosis not present

## 2022-12-06 DIAGNOSIS — M25551 Pain in right hip: Secondary | ICD-10-CM | POA: Diagnosis not present

## 2022-12-06 DIAGNOSIS — Z4789 Encounter for other orthopedic aftercare: Secondary | ICD-10-CM | POA: Diagnosis not present

## 2022-12-09 DIAGNOSIS — M25551 Pain in right hip: Secondary | ICD-10-CM | POA: Diagnosis not present

## 2022-12-09 DIAGNOSIS — Z4789 Encounter for other orthopedic aftercare: Secondary | ICD-10-CM | POA: Diagnosis not present

## 2022-12-13 ENCOUNTER — Encounter: Payer: Self-pay | Admitting: Orthopedic Surgery

## 2022-12-13 DIAGNOSIS — M25551 Pain in right hip: Secondary | ICD-10-CM | POA: Diagnosis not present

## 2022-12-13 DIAGNOSIS — Z4789 Encounter for other orthopedic aftercare: Secondary | ICD-10-CM | POA: Diagnosis not present

## 2022-12-15 ENCOUNTER — Ambulatory Visit (INDEPENDENT_AMBULATORY_CARE_PROVIDER_SITE_OTHER): Payer: BC Managed Care – PPO | Admitting: Orthopedic Surgery

## 2022-12-15 ENCOUNTER — Encounter: Payer: Self-pay | Admitting: Radiology

## 2022-12-15 DIAGNOSIS — T8149XA Infection following a procedure, other surgical site, initial encounter: Secondary | ICD-10-CM

## 2022-12-16 DIAGNOSIS — M25551 Pain in right hip: Secondary | ICD-10-CM | POA: Diagnosis not present

## 2022-12-16 DIAGNOSIS — Z4789 Encounter for other orthopedic aftercare: Secondary | ICD-10-CM | POA: Diagnosis not present

## 2022-12-21 ENCOUNTER — Encounter: Payer: Self-pay | Admitting: Orthopedic Surgery

## 2022-12-21 NOTE — Progress Notes (Signed)
Office Visit Note   Patient: Patricia Adkins           Date of Birth: 10-09-1971           MRN: [id number] Visit Date: 12/15/2022              Requested by: Aura Dials, [place],  [place] [address] PCP: Aura Dials, MD  Chief Complaint  Patient presents with   Right Hip - Routine Post Op    10/12/2022 right hip debridement       HPI: Patient is a 52 year old woman who is 2 months status post right hip debridement.  Patient states that she has episodes of clear serosanguineous drainage.  She states that it is dry today.  Assessment & Plan: Visit Diagnoses:  1. Postoperative wound infection of right hip     Plan: Recommended wearing compression shorts to help decrease the dead space.  Follow-Up Instructions: No follow-ups on file.   Ortho Exam  Patient is alert, oriented, no adenopathy, well-dressed, normal affect, normal respiratory effort. Examination there is no redness no cellulitis no drainage.  There is no fluctuation.  Drainage shows no signs of infection.  Imaging: No results found. No images are attached to the encounter.  Labs: Lab Results  Component Value Date   REPTSTATUS 10/17/2022 FINAL 10/12/2022   GRAMSTAIN NO WBC SEEN NO ORGANISMS SEEN  10/12/2022   CULT  10/12/2022    No growth aerobically or anaerobically. Performed at [facility], [place] [address] [place], [place] [address]      No results found for: "ALBUMIN", "PREALBUMIN", "CBC"  Lab Results  Component Value Date   MG 1.8 10/19/2022   No results found for: "VD25OH"  No results found for: "PREALBUMIN"    Latest Ref Rng & Units 10/19/2022   10:06 AM 10/12/2022   11:10 AM 08/31/2022    2:52 PM  CBC EXTENDED  WBC 4.0 - 10.5 K/uL 5.4  3.6  6.0   RBC 3.87 - 5.11 MIL/uL 3.79  4.39  4.63   Hemoglobin 12.0 - 15.0 g/dL 10.9  13.0  13.6   HCT 36.0 - 46.0 % 33.0  39.0  41.0   Platelets 150 - 400 K/uL 200  200  231   NEUT# 1.7 - 7.7 K/uL 4.2     Lymph# 0.7 - 4.0  K/uL 0.6        There is no height or weight on file to calculate BMI.  Orders:  No orders of the defined types were placed in this encounter.  No orders of the defined types were placed in this encounter.    Procedures: No procedures performed  Clinical Data: No additional findings.  ROS:  All other systems negative, except as noted in the HPI. Review of Systems  Objective: Vital Signs: There were no vitals taken for this visit.  Specialty Comments:  No specialty comments available.  PMFS History: Patient Active Problem List   Diagnosis Date Noted   Palpitations 10/21/2022   Abnormal EKG 10/21/2022   Abscess of bursa of right hip 10/12/2022   Gastrointestinal complaints 07/27/2022   Chronic rhinitis 07/27/2022   Past Medical History:  Diagnosis Date   Anemia    Anxiety    Atypical mole 08/03/2006   Left Abdomen (marked) (excision)   Atypical mole 08/03/2006   Lower Back (marked) (excision)   Atypical mole 05/23/2012   Left Low Back (moderate) (wider shave)   Atypical mole  06/28/2012   Left Paraspinal (moderate) (wider shave)   Atypical mole 06/28/2012   Right Lateral Back (mild)   Atypical mole 06/28/2012   Right Upper Buttock (severe) (wider shave)   Atypical mole 06/28/2012   Left Upper Arm (severe) (wider shave)   Atypical mole 07/19/2013   Left Post Shoulder Superior (moderate)   Atypical mole 07/19/2013   Left Post Shoulder Inferior (mild)   Atypical mole 07/19/2013   Right Migh Thigh (moderate)   Atypical mole 05/30/2014   Left Chin (mild)   Atypical mole 05/30/2014   Lower Abdomen (moderate)   Atypical mole 05/30/2014   Left Tricept (unsual spitz tumor) (wider shave)   Atypical mole 10/27/2015   Left Thigh (moderate)   Atypical mole 10/27/2015   Supra Pubic (moderate)   Atypical mole 06/07/2016   Right Post Shoulder (moderate)   Atypical mole 11/06/2017   Right Upper Abdomen (moderate)   Atypical mole 08/01/2018   Left Thigh  (moderate) (wider shave)   Atypical mole 08/01/2018   Right Forearm (severe) (excision)   Atypical mole 06/26/2019   Right Outer Mid Back (severe) (wider shave)   Bipolar 2 disorder (HCC)    (Dr. Toy Care)- Started after second pregnancy   Chronic kidney disease    Depression    Gastritis    Hypothyroidism    Lumbar degenerative disc disease    Migraine    headaches (Dr. Catalina Gravel)   Mitral valve prolapse    Pneumonia    PONV (postoperative nausea and vomiting)    Thyroid disease     Family History  Problem Relation Age of Onset   Allergic rhinitis Mother    Arthritis Mother    Hypertension Father        at a young age,    Hyperlipidemia Father    Allergic rhinitis Sister    Hypertension Sister    Asthma Son     Past Surgical History:  Procedure Laterality Date   ABDOMINAL HYSTERECTOMY     CESAREAN SECTION  10/10/1996   CYSTECTOMY     Uterine   DILATION AND CURETTAGE OF UTERUS     I & D EXTREMITY Right 10/12/2022   Procedure: RIGHT HIP DEBRIDEMENT;  Surgeon: Newt Minion, MD;  Location: [facility];  Service: Orthopedics;  Laterality: Right;   INCISION AND DRAINAGE OF WOUND Right 08/31/2022   Procedure: IRRIGATION AND DEBRIDEMENT HIP WOUND;  Surgeon: Nicholes Stairs, MD;  Location: [facility];  Service: Orthopedics;  Laterality: Right;  60 ok per OR   OPEN SURGICAL REPAIR OF GLUTEAL TENDON Right 06/17/2022   Procedure: RIGHT HIP OPEN REPAIR OF GLUTEAL MEDIUS;  Surgeon: Nicholes Stairs, MD;  Location: [facility];  Service: Orthopedics;  Laterality: Right;  120   PLANTAR FASCIA SURGERY Right    US ECHOCARDIOGRAPHY     Normal EF, mild MR/TR, Normal LV size and function. There were no regional wall motion abnormailities. Left Ventricular ejection fraction estimated by 2D at 60-65%. Mild mitral valve regurgitation. Mild tricuspid regurgitation   Social History   Occupational History   Not on file  Tobacco Use   Smoking status: Never   Smokeless tobacco: Never  Vaping Use    Vaping Use: Never used  Substance and Sexual Activity   Alcohol use: No   Drug use: No   Sexual activity: Not on file

## 2022-12-22 DIAGNOSIS — G43719 Chronic migraine without aura, intractable, without status migrainosus: Secondary | ICD-10-CM | POA: Diagnosis not present

## 2022-12-27 DIAGNOSIS — Z4789 Encounter for other orthopedic aftercare: Secondary | ICD-10-CM | POA: Diagnosis not present

## 2022-12-27 DIAGNOSIS — M25551 Pain in right hip: Secondary | ICD-10-CM | POA: Diagnosis not present

## 2022-12-28 DIAGNOSIS — Z13220 Encounter for screening for lipoid disorders: Secondary | ICD-10-CM | POA: Diagnosis not present

## 2022-12-28 DIAGNOSIS — F319 Bipolar disorder, unspecified: Secondary | ICD-10-CM | POA: Diagnosis not present

## 2022-12-28 DIAGNOSIS — Z79899 Other long term (current) drug therapy: Secondary | ICD-10-CM | POA: Diagnosis not present

## 2022-12-28 DIAGNOSIS — Z0000 Encounter for general adult medical examination without abnormal findings: Secondary | ICD-10-CM | POA: Diagnosis not present

## 2022-12-30 DIAGNOSIS — M25551 Pain in right hip: Secondary | ICD-10-CM | POA: Diagnosis not present

## 2022-12-30 DIAGNOSIS — Z4789 Encounter for other orthopedic aftercare: Secondary | ICD-10-CM | POA: Diagnosis not present

## 2023-01-02 DIAGNOSIS — F3174 Bipolar disorder, in full remission, most recent episode manic: Secondary | ICD-10-CM | POA: Diagnosis not present

## 2023-01-02 DIAGNOSIS — F410 Panic disorder [episodic paroxysmal anxiety] without agoraphobia: Secondary | ICD-10-CM | POA: Diagnosis not present

## 2023-01-02 DIAGNOSIS — F3176 Bipolar disorder, in full remission, most recent episode depressed: Secondary | ICD-10-CM | POA: Diagnosis not present

## 2023-01-03 DIAGNOSIS — Z4789 Encounter for other orthopedic aftercare: Secondary | ICD-10-CM | POA: Diagnosis not present

## 2023-01-03 DIAGNOSIS — M25551 Pain in right hip: Secondary | ICD-10-CM | POA: Diagnosis not present

## 2023-01-04 ENCOUNTER — Encounter: Payer: Self-pay | Admitting: Orthopedic Surgery

## 2023-01-05 DIAGNOSIS — M25551 Pain in right hip: Secondary | ICD-10-CM | POA: Diagnosis not present

## 2023-01-05 DIAGNOSIS — Z4789 Encounter for other orthopedic aftercare: Secondary | ICD-10-CM | POA: Diagnosis not present

## 2023-01-13 DIAGNOSIS — M25551 Pain in right hip: Secondary | ICD-10-CM | POA: Diagnosis not present

## 2023-01-13 DIAGNOSIS — Z4789 Encounter for other orthopedic aftercare: Secondary | ICD-10-CM | POA: Diagnosis not present

## 2023-01-20 DIAGNOSIS — M25551 Pain in right hip: Secondary | ICD-10-CM | POA: Diagnosis not present

## 2023-01-20 DIAGNOSIS — Z4789 Encounter for other orthopedic aftercare: Secondary | ICD-10-CM | POA: Diagnosis not present

## 2023-01-23 ENCOUNTER — Encounter: Payer: Self-pay | Admitting: Orthopedic Surgery

## 2023-01-23 DIAGNOSIS — R140 Abdominal distension (gaseous): Secondary | ICD-10-CM | POA: Diagnosis not present

## 2023-01-23 DIAGNOSIS — Z1211 Encounter for screening for malignant neoplasm of colon: Secondary | ICD-10-CM | POA: Diagnosis not present

## 2023-01-24 ENCOUNTER — Ambulatory Visit (INDEPENDENT_AMBULATORY_CARE_PROVIDER_SITE_OTHER): Payer: BC Managed Care – PPO | Admitting: Orthopedic Surgery

## 2023-01-24 DIAGNOSIS — T8149XA Infection following a procedure, other surgical site, initial encounter: Secondary | ICD-10-CM | POA: Diagnosis not present

## 2023-01-27 DIAGNOSIS — Z4789 Encounter for other orthopedic aftercare: Secondary | ICD-10-CM | POA: Diagnosis not present

## 2023-01-27 DIAGNOSIS — M25551 Pain in right hip: Secondary | ICD-10-CM | POA: Diagnosis not present

## 2023-01-29 ENCOUNTER — Encounter: Payer: Self-pay | Admitting: Orthopedic Surgery

## 2023-01-29 NOTE — Progress Notes (Signed)
Office Visit Note   Patient: Patricia Adkins           Date of Birth: [birth year]           MRN: [id number] Visit Date: 01/24/2023              Requested by: Henrine Screws, MD [address] [place],  [place] [address] PCP: Henrine Screws, MD  Chief Complaint  Patient presents with   Right Hip - Follow-up    10/12/2022 right hip debridement      HPI: Patient is a 52 year old woman status post debridement right hip wound and infection.  She is over 3 months out from surgery she states she occasionally gets some drainage from a pinhole wound at the incision with clear drainage.  Patient states she had increased drainage on Saturday.  Patient states she wore compression shorts for about a month.  Assessment & Plan: Visit Diagnoses:  1. Postoperative wound infection of right hip     Plan: Clinically patient has no signs of infection.  This is most likely due to decreased lymphatic function from her previous surgeries.  Follow-Up Instructions: No follow-ups on file.   Ortho Exam  Patient is alert, oriented, no adenopathy, well-dressed, normal affect, normal respiratory effort. Examination the incision is well-healed today there is no cellulitis no tenderness to palpation I cannot express any fluid.  There is no swelling.  Imaging: No results found.   Labs: Lab Results  Component Value Date   REPTSTATUS 10/17/2022 FINAL 10/12/2022   GRAMSTAIN NO WBC SEEN NO ORGANISMS SEEN  10/12/2022   CULT  10/12/2022    No growth aerobically or anaerobically. Performed at [facility], [address], [place], [place] [address]      No results found for: "ALBUMIN", "PREALBUMIN", "CBC"  Lab Results  Component Value Date   MG 1.8 10/19/2022   No results found for: "VD25OH"  No results found for: "PREALBUMIN"    Latest Ref Rng & Units 10/19/2022   10:06 AM 10/12/2022   11:10 AM 08/31/2022    2:52 PM  CBC EXTENDED  WBC 4.0 - 10.5 K/uL 5.4  3.6  6.0   RBC 3.87 - 5.11 MIL/uL  3.79  4.39  4.63   Hemoglobin 12.0 - 15.0 g/dL 82.9  56.2  13.0   HCT 36.0 - 46.0 % 33.0  39.0  41.0   Platelets 150 - 400 K/uL 200  200  231   NEUT# 1.7 - 7.7 K/uL 4.2     Lymph# 0.7 - 4.0 K/uL 0.6        There is no height or weight on file to calculate BMI.  Orders:  No orders of the defined types were placed in this encounter.  No orders of the defined types were placed in this encounter.    Procedures: No procedures performed  Clinical Data: No additional findings.  ROS:  All other systems negative, except as noted in the HPI. Review of Systems  Objective: Vital Signs: There were no vitals taken for this visit.  Specialty Comments:  No specialty comments available.  PMFS History: Patient Active Problem List   Diagnosis Date Noted   Palpitations 10/21/2022   Abnormal EKG 10/21/2022   Abscess of bursa of right hip 10/12/2022   Gastrointestinal complaints 07/27/2022   Chronic rhinitis 07/27/2022   Past Medical History:  Diagnosis Date   Anemia    Anxiety    Atypical mole 08/03/2006   Left  Abdomen (marked) (excision)   Atypical mole 08/03/2006   Lower Back (marked) (excision)   Atypical mole 05/23/2012   Left Low Back (moderate) (wider shave)   Atypical mole 06/28/2012   Left Paraspinal (moderate) (wider shave)   Atypical mole 06/28/2012   Right Lateral Back (mild)   Atypical mole 06/28/2012   Right Upper Buttock (severe) (wider shave)   Atypical mole 06/28/2012   Left Upper Arm (severe) (wider shave)   Atypical mole 07/19/2013   Left Post Shoulder Superior (moderate)   Atypical mole 07/19/2013   Left Post Shoulder Inferior (mild)   Atypical mole 07/19/2013   Right Migh Thigh (moderate)   Atypical mole 05/30/2014   Left Chin (mild)   Atypical mole 05/30/2014   Lower Abdomen (moderate)   Atypical mole 05/30/2014   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 Evelene Croon)- Started after second pregnancy   Chronic kidney disease    Depression    Gastritis    Hypothyroidism    Lumbar degenerative disc disease    Migraine    headaches (Dr. Clarisse Gouge)   Mitral valve prolapse    Pneumonia    PONV (postoperative nausea and vomiting)    Thyroid disease     Family History  Problem Relation Age of Onset   Allergic rhinitis Mother    Arthritis Mother    Hypertension Father        at a young age,    Hyperlipidemia Father    Allergic rhinitis Sister    Hypertension Sister    Asthma Son     Past Surgical History:  Procedure Laterality Date   ABDOMINAL HYSTERECTOMY     CESAREAN SECTION  10/10/1996   CYSTECTOMY     Uterine   DILATION AND CURETTAGE OF UTERUS     I & D EXTREMITY Right 10/12/2022   Procedure: RIGHT HIP DEBRIDEMENT;  Surgeon: Nadara Mustard, MD;  Location: [facility];  Service: Orthopedics;  Laterality: Right;   INCISION AND DRAINAGE OF WOUND Right 08/31/2022   Procedure: IRRIGATION AND DEBRIDEMENT HIP WOUND;  Surgeon: Yolonda Kida, MD;  Location: [facility];  Service: Orthopedics;  Laterality: Right;  60 ok per OR   OPEN SURGICAL REPAIR OF GLUTEAL TENDON Right 06/17/2022   Procedure: RIGHT HIP OPEN REPAIR OF GLUTEAL MEDIUS;  Surgeon: Yolonda Kida, MD;  Location: [facility];  Service: Orthopedics;  Laterality: Right;  120   PLANTAR FASCIA SURGERY Right    US ECHOCARDIOGRAPHY     Normal EF, mild MR/TR, Normal LV size and function. There were no regional wall motion abnormailities. Left Ventricular ejection fraction estimated by 2D at 60-65%. Mild mitral valve  regurgitation. Mild tricuspid regurgitation   Social History   Occupational History   Not on file  Tobacco Use   Smoking status: Never   Smokeless tobacco: Never  Vaping Use   Vaping Use: Never used  Substance and Sexual Activity   Alcohol use: No   Drug use: No  Sexual activity: Not on file

## 2023-02-03 DIAGNOSIS — M25551 Pain in right hip: Secondary | ICD-10-CM | POA: Diagnosis not present

## 2023-02-03 DIAGNOSIS — Z4789 Encounter for other orthopedic aftercare: Secondary | ICD-10-CM | POA: Diagnosis not present

## 2023-02-03 DIAGNOSIS — D509 Iron deficiency anemia, unspecified: Secondary | ICD-10-CM | POA: Diagnosis not present

## 2023-02-03 DIAGNOSIS — H16223 Keratoconjunctivitis sicca, not specified as Sjogren's, bilateral: Secondary | ICD-10-CM | POA: Diagnosis not present

## 2023-02-06 ENCOUNTER — Encounter: Payer: Self-pay | Admitting: Orthopedic Surgery

## 2023-02-10 DIAGNOSIS — M25551 Pain in right hip: Secondary | ICD-10-CM | POA: Diagnosis not present

## 2023-02-10 DIAGNOSIS — Z4789 Encounter for other orthopedic aftercare: Secondary | ICD-10-CM | POA: Diagnosis not present

## 2023-02-17 DIAGNOSIS — M25551 Pain in right hip: Secondary | ICD-10-CM | POA: Diagnosis not present

## 2023-02-17 DIAGNOSIS — Z4789 Encounter for other orthopedic aftercare: Secondary | ICD-10-CM | POA: Diagnosis not present

## 2023-02-22 ENCOUNTER — Ambulatory Visit: Payer: BC Managed Care – PPO | Admitting: Neurology

## 2023-02-22 DIAGNOSIS — M25551 Pain in right hip: Secondary | ICD-10-CM | POA: Diagnosis not present

## 2023-02-22 DIAGNOSIS — Z4789 Encounter for other orthopedic aftercare: Secondary | ICD-10-CM | POA: Diagnosis not present

## 2023-02-27 ENCOUNTER — Ambulatory Visit: Payer: BC Managed Care – PPO | Admitting: Family Medicine

## 2023-03-02 DIAGNOSIS — M25551 Pain in right hip: Secondary | ICD-10-CM | POA: Diagnosis not present

## 2023-03-02 DIAGNOSIS — Z4789 Encounter for other orthopedic aftercare: Secondary | ICD-10-CM | POA: Diagnosis not present

## 2023-03-13 DIAGNOSIS — M25551 Pain in right hip: Secondary | ICD-10-CM | POA: Diagnosis not present

## 2023-03-13 DIAGNOSIS — Z4789 Encounter for other orthopedic aftercare: Secondary | ICD-10-CM | POA: Diagnosis not present

## 2023-03-17 DIAGNOSIS — F3112 Bipolar disorder, current episode manic without psychotic features, moderate: Secondary | ICD-10-CM | POA: Diagnosis not present

## 2023-03-17 DIAGNOSIS — F410 Panic disorder [episodic paroxysmal anxiety] without agoraphobia: Secondary | ICD-10-CM | POA: Diagnosis not present

## 2023-03-17 DIAGNOSIS — F3131 Bipolar disorder, current episode depressed, mild: Secondary | ICD-10-CM | POA: Diagnosis not present

## 2023-03-17 DIAGNOSIS — F5101 Primary insomnia: Secondary | ICD-10-CM | POA: Diagnosis not present

## 2023-03-20 DIAGNOSIS — M25551 Pain in right hip: Secondary | ICD-10-CM | POA: Diagnosis not present

## 2023-03-23 DIAGNOSIS — Z4789 Encounter for other orthopedic aftercare: Secondary | ICD-10-CM | POA: Diagnosis not present

## 2023-03-23 DIAGNOSIS — M25551 Pain in right hip: Secondary | ICD-10-CM | POA: Diagnosis not present

## 2023-03-24 DIAGNOSIS — M25551 Pain in right hip: Secondary | ICD-10-CM | POA: Diagnosis not present

## 2023-03-27 ENCOUNTER — Other Ambulatory Visit: Payer: Self-pay | Admitting: Family

## 2023-03-27 DIAGNOSIS — N6320 Unspecified lump in the left breast, unspecified quadrant: Secondary | ICD-10-CM

## 2023-03-28 DIAGNOSIS — M25551 Pain in right hip: Secondary | ICD-10-CM | POA: Diagnosis not present

## 2023-03-30 DIAGNOSIS — Z6824 Body mass index (BMI) 24.0-24.9, adult: Secondary | ICD-10-CM | POA: Diagnosis not present

## 2023-03-30 DIAGNOSIS — Z01419 Encounter for gynecological examination (general) (routine) without abnormal findings: Secondary | ICD-10-CM | POA: Diagnosis not present

## 2023-04-04 ENCOUNTER — Ambulatory Visit
Admission: RE | Admit: 2023-04-04 | Discharge: 2023-04-04 | Disposition: A | Discharge status: Home / Self Care | Payer: BC Managed Care – PPO | Source: Ambulatory Visit | Attending: Family | Admitting: Family

## 2023-04-04 DIAGNOSIS — N6320 Unspecified lump in the left breast, unspecified quadrant: Secondary | ICD-10-CM

## 2023-04-04 DIAGNOSIS — N644 Mastodynia: Secondary | ICD-10-CM | POA: Diagnosis not present

## 2023-04-04 DIAGNOSIS — N6325 Unspecified lump in the left breast, overlapping quadrants: Secondary | ICD-10-CM | POA: Diagnosis not present

## 2023-04-07 DIAGNOSIS — Z4789 Encounter for other orthopedic aftercare: Secondary | ICD-10-CM | POA: Diagnosis not present

## 2023-04-07 DIAGNOSIS — M25551 Pain in right hip: Secondary | ICD-10-CM | POA: Diagnosis not present

## 2023-04-07 NOTE — Telephone Encounter (Signed)
See above

## 2023-04-11 ENCOUNTER — Encounter: Payer: Self-pay | Admitting: Orthopedic Surgery

## 2023-04-11 DIAGNOSIS — Z4789 Encounter for other orthopedic aftercare: Secondary | ICD-10-CM | POA: Diagnosis not present

## 2023-04-11 DIAGNOSIS — M25551 Pain in right hip: Secondary | ICD-10-CM | POA: Diagnosis not present

## 2023-04-18 DIAGNOSIS — F3174 Bipolar disorder, in full remission, most recent episode manic: Secondary | ICD-10-CM | POA: Diagnosis not present

## 2023-04-18 DIAGNOSIS — F410 Panic disorder [episodic paroxysmal anxiety] without agoraphobia: Secondary | ICD-10-CM | POA: Diagnosis not present

## 2023-04-18 DIAGNOSIS — F3176 Bipolar disorder, in full remission, most recent episode depressed: Secondary | ICD-10-CM | POA: Diagnosis not present

## 2023-04-18 DIAGNOSIS — F5101 Primary insomnia: Secondary | ICD-10-CM | POA: Diagnosis not present

## 2023-04-19 DIAGNOSIS — Z4789 Encounter for other orthopedic aftercare: Secondary | ICD-10-CM | POA: Diagnosis not present

## 2023-04-19 DIAGNOSIS — M25551 Pain in right hip: Secondary | ICD-10-CM | POA: Diagnosis not present

## 2023-04-20 ENCOUNTER — Encounter: Payer: Self-pay | Admitting: Orthopedic Surgery

## 2023-04-20 ENCOUNTER — Ambulatory Visit (INDEPENDENT_AMBULATORY_CARE_PROVIDER_SITE_OTHER): Payer: BC Managed Care – PPO | Admitting: Orthopedic Surgery

## 2023-04-20 DIAGNOSIS — M25551 Pain in right hip: Secondary | ICD-10-CM | POA: Diagnosis not present

## 2023-04-20 DIAGNOSIS — T8149XA Infection following a procedure, other surgical site, initial encounter: Secondary | ICD-10-CM | POA: Diagnosis not present

## 2023-04-25 ENCOUNTER — Encounter: Payer: Self-pay | Admitting: Sports Medicine

## 2023-04-28 ENCOUNTER — Encounter: Payer: Self-pay | Admitting: Orthopedic Surgery

## 2023-04-28 NOTE — Progress Notes (Signed)
Office Visit Note   Patient: Patricia Adkins           Date of Birth: 18-Jun-1971           MRN: [id number] Visit Date: 04/20/2023              Requested by: Henrine Screws, MD [address] [place],  [place] [address] PCP: Henrine Screws, MD  Chief Complaint  Patient presents with   Right Hip - Follow-up      HPI: She has had an MRI at [facility], I do not have access to their MRI, went to [facility] wound [facility], 04/25/23, PRP injection at [facility] later this month  Assessment & Plan: Visit Diagnoses:  1. Pain in right hip   2. Postoperative wound infection of right hip     Plan: recommended ESWT with Dr Shon Baton  Follow-Up Instructions: No follow-ups on file.   Ortho Exam  Patient is alert, oriented, no adenopathy, well-dressed, normal affect, normal respiratory effort. The wound is healed, no cellulitis, report of drainage but none now, no swelling, no fluctuance  Imaging: No results found. No images are attached to the encounter.  Labs: Lab Results  Component Value Date   REPTSTATUS 10/17/2022 FINAL 10/12/2022   GRAMSTAIN NO WBC SEEN NO ORGANISMS SEEN  10/12/2022   CULT  10/12/2022    No growth aerobically or anaerobically. Performed at [facility], [address], [place], [place] [address]      No results found for: "ALBUMIN", "PREALBUMIN", "CBC"  Lab Results  Component Value Date   MG 1.8 10/19/2022   No results found for: "VD25OH"  No results found for: "PREALBUMIN"    Latest Ref Rng & Units 10/19/2022   10:06 AM 10/12/2022   11:10 AM 08/31/2022    2:52 PM  CBC EXTENDED  WBC 4.0 - 10.5 K/uL 5.4  3.6  6.0   RBC 3.87 - 5.11 MIL/uL 3.79  4.39  4.63   Hemoglobin 12.0 - 15.0 g/dL 62.9  52.8  41.3   HCT 36.0 - 46.0 % 33.0  39.0  41.0   Platelets 150 - 400 K/uL 200  200  231   NEUT# 1.7 - 7.7 K/uL 4.2     Lymph# 0.7 - 4.0 K/uL 0.6        There is no height or weight on file to calculate BMI.  Orders:  Orders Placed This Encounter   Procedures   Ambulatory referral to Orthopedics   No orders of the defined types were placed in this encounter.    Procedures: No procedures performed  Clinical Data: No additional findings.  ROS:  All other systems negative, except as noted in the HPI. Review of Systems  Objective: Vital Signs: There were no vitals taken for this visit.  Specialty Comments:  No specialty comments available.  PMFS History: Patient Active Problem List   Diagnosis Date Noted   Palpitations 10/21/2022   Abnormal EKG 10/21/2022   Abscess of bursa of right hip 10/12/2022   Gastrointestinal complaints 07/27/2022   Chronic rhinitis 07/27/2022   Past Medical History:  Diagnosis Date   Anemia    Anxiety    Atypical mole 08/03/2006   Left Abdomen (marked) (excision)   Atypical mole 08/03/2006   Lower Back (marked) (excision)   Atypical mole 05/23/2012   Left Low Back (moderate) (wider shave)   Atypical mole 06/28/2012   Left Paraspinal (moderate) (wider shave)   Atypical mole 06/28/2012   Right  Lateral Back (mild)   Atypical mole 06/28/2012   Right Upper Buttock (severe) (wider shave)   Atypical mole 06/28/2012   Left Upper Arm (severe) (wider shave)   Atypical mole 07/19/2013   Left Post Shoulder Superior (moderate)   Atypical mole 07/19/2013   Left Post Shoulder Inferior (mild)   Atypical mole 07/19/2013   Right Migh Thigh (moderate)   Atypical mole 05/30/2014   Left Chin (mild)   Atypical mole 05/30/2014   Lower Abdomen (moderate)   Atypical mole 05/30/2014   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 Evelene Croon)- Started after second pregnancy   Chronic kidney disease    Depression    Gastritis    Hypothyroidism    Lumbar degenerative disc disease    Migraine    headaches (Dr. Clarisse Gouge)   Mitral valve prolapse    Pneumonia    PONV (postoperative nausea and vomiting)    Thyroid disease     Family History  Problem Relation Age of Onset   Allergic rhinitis Mother    Arthritis Mother    Hypertension Father        at a young age,    Hyperlipidemia Father    Allergic rhinitis Sister    Hypertension Sister    Asthma Son     Past Surgical History:  Procedure Laterality Date   ABDOMINAL HYSTERECTOMY     CESAREAN SECTION  10/10/1996   CYSTECTOMY     Uterine   DILATION AND CURETTAGE OF UTERUS     I & D EXTREMITY Right 10/12/2022   Procedure: RIGHT HIP DEBRIDEMENT;  Surgeon: Nadara Mustard, MD;  Location: [facility];  Service: Orthopedics;  Laterality: Right;   INCISION AND DRAINAGE OF WOUND Right 08/31/2022   Procedure: IRRIGATION AND DEBRIDEMENT HIP WOUND;  Surgeon: Yolonda Kida, MD;  Location: [facility];  Service: Orthopedics;  Laterality: Right;  60 ok per OR   OPEN SURGICAL REPAIR OF GLUTEAL TENDON Right 06/17/2022   Procedure: RIGHT HIP OPEN REPAIR OF GLUTEAL MEDIUS;  Surgeon: Yolonda Kida, MD;  Location: [facility];  Service: Orthopedics;  Laterality: Right;  120   PLANTAR FASCIA SURGERY Right    US ECHOCARDIOGRAPHY     Normal EF, mild MR/TR, Normal LV size and function. There were no regional wall motion abnormailities. Left Ventricular ejection fraction estimated by 2D at 60-65%. Mild mitral valve regurgitation. Mild tricuspid regurgitation   Social History   Occupational History   Not on file  Tobacco Use   Smoking status: Never   Smokeless tobacco: Never  Vaping Use   Vaping status: Never Used  Substance and Sexual Activity   Alcohol use: No   Drug use: No   Sexual  activity: Not on file

## 2023-04-29 ENCOUNTER — Encounter: Payer: Self-pay | Admitting: Orthopedic Surgery

## 2023-04-29 DIAGNOSIS — R519 Headache, unspecified: Secondary | ICD-10-CM | POA: Diagnosis not present

## 2023-04-29 DIAGNOSIS — J029 Acute pharyngitis, unspecified: Secondary | ICD-10-CM | POA: Diagnosis not present

## 2023-04-29 DIAGNOSIS — R5383 Other fatigue: Secondary | ICD-10-CM | POA: Diagnosis not present

## 2023-05-02 ENCOUNTER — Encounter: Payer: Self-pay | Admitting: Sports Medicine

## 2023-05-02 ENCOUNTER — Ambulatory Visit: Payer: BC Managed Care – PPO | Admitting: Sports Medicine

## 2023-05-02 DIAGNOSIS — M25551 Pain in right hip: Secondary | ICD-10-CM | POA: Diagnosis not present

## 2023-05-02 DIAGNOSIS — T8149XA Infection following a procedure, other surgical site, initial encounter: Secondary | ICD-10-CM | POA: Diagnosis not present

## 2023-05-02 DIAGNOSIS — S76011D Strain of muscle, fascia and tendon of right hip, subsequent encounter: Secondary | ICD-10-CM

## 2023-05-02 NOTE — Progress Notes (Signed)
Right hip pain; [facility] referral History of torn glute med/min- had repair but then suffered infections New mri showed they were retorn and is here for shockwave today

## 2023-05-02 NOTE — Progress Notes (Signed)
Patricia Adkins - 52 y.o. female MRN [id number]  Date of birth: [birth year]  Office Visit Note: Visit Date: 05/02/2023 PCP: Henrine Screws, MD Referred by: Nadara Mustard, MD  Subjective: Chief Complaint  Patient presents with   Right Hip - Pain   HPI: Patricia Adkins is a pleasant 52 y.o. female who presents today for right lateral hip pain.  She has had quite complexity of her right hip.  Started last year when she had an MRI which showed a gluteus medius and gluteus minimus tear.  She was seen at [facility] by Dr. Aundria Rud who performed a gluteus medius repair on 06/17/2022.  This did well for about 2 months until she started developing an infection in November. This was fixed and then ultimately returned and she ended up seeing my partner Dr. Lajoyce Corners and is status post right hip debridement and washout on 10/12/2022.  She has done formalized physical therapy and has been seen multiple times for care of this wound with Dr. Lajoyce Corners. Currently is not infected.  Pertinent ROS were reviewed with the patient and found to be negative unless otherwise specified above in HPI.   Assessment & Plan: Visit Diagnoses:  1. Tear of right gluteus medius tendon, subsequent encounter   2. Tear of right gluteus minimus tendon, subsequent encounter   3. Lateral pain of right hip   4. Postoperative wound infection of right hip    Plan: Discussed with Toniann Fail treatment options for her right lateral hip pain status post gluteus medius repair, correction surgery and postoperative infection.  We did perform a trial of extracorporeal shockwave therapy, she tolerated well.  Will allow her to get back into the recumbent bike, stabilization exercises for the hip but will hold on hip abduction strengthening at this time.  She will follow-up in 1 week for shockwave treatment only.  After 2 sessions if she notices some improvement, we will likely proceed with additional shockwave only treatments. F/u in 1 week.  Follow-up: Return in  about 1 week (around 05/09/2023) for for Shockwave R-hip ($60 only).   Meds & Orders: No orders of the defined types were placed in this encounter.  No orders of the defined types were placed in this encounter.    Procedures: Procedure: ECSWT Indications:  Gluteus medius/minimus tear s/p repair and subsequent post-op infection (pain)   Procedure Details Consent: Risks of procedure as well as the alternatives and risks of each were explained to the patient.  Verbal consent for procedure obtained. Time Out: Verified patient identification, verified procedure, site was marked, verified correct patient position. The area was cleaned with alcohol swab.     The right greater trochanteric region was targeted for Extracorporeal shockwave therapy.    Preset: Greater Trochanteric Pain Syndrome/Bursitis Power Level: 110 mJ Frequency: 11 Hz Impulse/cycles: 2800 Head size: Regular   Patient tolerated procedure well without immediate complications.        Clinical History: No specialty comments available.  She reports that she has never smoked. She has never used smokeless tobacco. No results for input(s): "HGBA1C", "LABURIC" in the last 8760 hours.  Objective:   Vital Signs: There were no vitals taken for this visit.  Physical Exam  Gen: Well-appearing, in no acute distress; non-toxic CV: Well-perfused. Warm.  Resp: Breathing unlabored on room air; no wheezing. Psych: Fluid speech in conversation; appropriate affect; normal thought process Neuro: Sensation intact throughout. No gross coordination deficits.   Ortho Exam - Right lateral hip: Mild TTP over the  greater trochanteric region and palpation down the mid IT band.  There is a well-healed large incision from prior gluteus repair.  Very mild fluctuance over the distal end without any warmth or redness.  No restrictions to internal or external logroll.  Imaging:  Narrative & Impression  CLINICAL DATA:  Right hip pain since 2020.    EXAM: MR OF THE RIGHT HIP WITHOUT CONTRAST   TECHNIQUE: Multiplanar, multisequence MR imaging was performed. No intravenous contrast was administered.   COMPARISON:  None Available.   FINDINGS: Bones:   No hip fracture, dislocation or avascular necrosis.   No periosteal reaction or bone destruction. No aggressive osseous lesion.   Benign subcortical cyst in the superior anterior femoral head-neck junction bilaterally. Osseous prominence of the superior femoral head-neck junction bilaterally.   Degenerative disc disease with disc height loss at L5-S1.   Normal sacrum and sacroiliac joints. No SI joint widening or erosive changes.   Articular cartilage and labrum   Articular cartilage:  No chondral defect.   Labrum: Limited evaluation of the labrum secondary lack of intra-articular fluid. Superior labral degeneration with blunting of the free edge concerning for a small tear. Small tear of the superior anterior right labrum.   Joint or bursal effusion   Joint effusion:  No hip joint effusion.  No SI joint effusion.   Bursae:  No bursa formation.   Muscles and tendons   Flexors: Normal.   Extensors: Normal.   Abductors: Normal.   Adductors: Normal.   Gluteals: High-grade partial-thickness tear of the right gluteus medius tendon insertion and right gluteus minimus tendon insertion.   Hamstrings: Normal.   Other findings   No pelvic free fluid. No fluid collection or hematoma. No inguinal lymphadenopathy. No inguinal hernia.   IMPRESSION: 1. High-grade partial-thickness tear of the right gluteus medius tendon insertion and right gluteus minimus tendon insertion. 2. Limited evaluation of the labrum secondary lack of intra-articular fluid. Superior labral degeneration with blunting of the free edge concerning for a small tear. Small tear of the superior anterior right labrum.     Electronically Signed   By: Elige Ko M.D.   On: 04/07/2022 15:17     Past Medical/Family/Surgical/Social History: Medications & Allergies reviewed per EMR, new medications updated. Patient Active Problem List   Diagnosis Date Noted   Palpitations 10/21/2022   Abnormal EKG 10/21/2022   Abscess of bursa of right hip 10/12/2022   Gastrointestinal complaints 07/27/2022   Chronic rhinitis 07/27/2022   Past Medical History:  Diagnosis Date   Anemia    Anxiety    Atypical mole 08/03/2006   Left Abdomen (marked) (excision)   Atypical mole 08/03/2006   Lower Back (marked) (excision)   Atypical mole 05/23/2012   Left Low Back (moderate) (wider shave)   Atypical mole 06/28/2012   Left Paraspinal (moderate) (wider shave)   Atypical mole 06/28/2012   Right Lateral Back (mild)   Atypical mole 06/28/2012   Right Upper Buttock (severe) (wider shave)   Atypical mole 06/28/2012   Left Upper Arm (severe) (wider shave)   Atypical mole 07/19/2013   Left Post Shoulder Superior (moderate)   Atypical mole 07/19/2013   Left Post Shoulder Inferior (mild)   Atypical mole 07/19/2013   Right Migh Thigh (moderate)   Atypical mole 05/30/2014   Left Chin (mild)   Atypical mole 05/30/2014   Lower Abdomen (moderate)   Atypical mole 05/30/2014   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 Evelene Croon)- Started after second pregnancy   Chronic kidney disease    Depression    Gastritis    Hypothyroidism    Lumbar degenerative disc disease    Migraine    headaches (Dr. Clarisse Gouge)   Mitral valve prolapse     Pneumonia    PONV (postoperative nausea and vomiting)    Thyroid disease    Family History  Problem Relation Age of Onset   Allergic rhinitis Mother    Arthritis Mother    Hypertension Father        at a young age,    Hyperlipidemia Father    Allergic rhinitis Sister    Hypertension Sister    Asthma Son    Past Surgical History:  Procedure Laterality Date   ABDOMINAL HYSTERECTOMY     CESAREAN SECTION  10/10/1996   CYSTECTOMY     Uterine   DILATION AND CURETTAGE OF UTERUS     I & D EXTREMITY Right 10/12/2022   Procedure: RIGHT HIP DEBRIDEMENT;  Surgeon: Nadara Mustard, MD;  Location: [facility] OR;  Service: Orthopedics;  Laterality: Right;   INCISION AND DRAINAGE OF WOUND Right 08/31/2022   Procedure: IRRIGATION AND DEBRIDEMENT HIP WOUND;  Surgeon: Yolonda Kida, MD;  Location: [facility] OR;  Service: Orthopedics;  Laterality: Right;  60 ok per OR   OPEN SURGICAL REPAIR OF GLUTEAL TENDON Right 06/17/2022   Procedure: RIGHT HIP OPEN REPAIR OF GLUTEAL MEDIUS;  Surgeon: Yolonda Kida, MD;  Location: [facility] ORS;  Service: Orthopedics;  Laterality: Right;  120   PLANTAR FASCIA SURGERY Right    US ECHOCARDIOGRAPHY     Normal EF, mild MR/TR, Normal LV size and function. There were no regional wall motion abnormailities. Left Ventricular ejection fraction estimated by 2D at 60-65%. Mild mitral valve regurgitation. Mild tricuspid regurgitation   Social History   Occupational History   Not on file  Tobacco Use   Smoking status: Never   Smokeless tobacco: Never  Vaping Use   Vaping status: Never Used  Substance and Sexual Activity   Alcohol use: No   Drug use: No   Sexual activity: Not on file

## 2023-05-09 ENCOUNTER — Encounter: Payer: Self-pay | Admitting: Orthopedic Surgery

## 2023-05-15 DIAGNOSIS — H1132 Conjunctival hemorrhage, left eye: Secondary | ICD-10-CM | POA: Diagnosis not present

## 2023-05-17 ENCOUNTER — Ambulatory Visit: Payer: BC Managed Care – PPO | Admitting: Sports Medicine

## 2023-05-18 ENCOUNTER — Encounter: Payer: Self-pay | Admitting: Sports Medicine

## 2023-05-18 ENCOUNTER — Ambulatory Visit: Payer: Self-pay | Admitting: Sports Medicine

## 2023-05-18 DIAGNOSIS — S76011D Strain of muscle, fascia and tendon of right hip, subsequent encounter: Secondary | ICD-10-CM

## 2023-05-18 DIAGNOSIS — M25551 Pain in right hip: Secondary | ICD-10-CM

## 2023-05-18 NOTE — Progress Notes (Signed)
   Procedure Note  Patricia Adkins is a pleasant 52 year-old female who presents today for ECSWT #2.  She did have quite a bit of soreness for the first week after her first treatment.  Patient: Patricia Adkins             Date of Birth: 11-14-1970           MRN: [id number]             Visit Date: 05/18/2023  Procedures: Visit Diagnoses:  1. Tear of right gluteus medius tendon, subsequent encounter   2. Tear of right gluteus minimus tendon, subsequent encounter   3. Lateral pain of right hip     Procedure: ECSWT Indications:  Gluteus medius/minimus tear s/p repair and subsequent post-op infection (pain)   Procedure Details Consent: Risks of procedure as well as the alternatives and risks of each were explained to the patient.  Verbal consent for procedure obtained. Time Out: Verified patient identification, verified procedure, site was marked, verified correct patient position. The area was cleaned with alcohol swab.     The right greater trochanteric region was targeted for Extracorporeal shockwave therapy.    Preset: Greater Trochanteric Pain Syndrome/Bursitis Power Level: 80 mJ Frequency: 10 Hz Impulse/cycles: 2500 Head size: Regular   Patient tolerated procedure well without immediate complications.   - did reduce treatment intensity today, tolerated better - f/u in 1-week for additional shockwave treatment  Madelyn Brunner, DO Primary Care Sports Medicine Physician  [facility] - Orthopedics  This note was dictated using Dragon naturally speaking software and may contain errors in syntax, spelling, or content which have not been identified prior to signing this note.

## 2023-05-25 ENCOUNTER — Encounter: Payer: Self-pay | Admitting: Sports Medicine

## 2023-05-25 ENCOUNTER — Ambulatory Visit: Payer: BC Managed Care – PPO | Admitting: Sports Medicine

## 2023-05-25 DIAGNOSIS — M25551 Pain in right hip: Secondary | ICD-10-CM

## 2023-05-25 DIAGNOSIS — S76011D Strain of muscle, fascia and tendon of right hip, subsequent encounter: Secondary | ICD-10-CM

## 2023-05-25 NOTE — Progress Notes (Signed)
   Procedure Note  Patient: Patricia Adkins             Date of Birth: Jan 05, 1971           MRN: [id number]             Visit Date: 05/25/2023  [name] is a pleasant 52 year-old female who presents today for ECSWT #3.  She did tolerate last treatment better on slightly lower setting. Noticing some improvement.  Procedures: Visit Diagnoses:  1. Tear of right gluteus medius tendon, subsequent encounter   2. Tear of right gluteus minimus tendon, subsequent encounter   3. Lateral pain of right hip    Procedure: ECSWT Indications:  Gluteus medius/minimus tear s/p repair and subsequent post-op infection (pain)   Procedure Details Consent: Risks of procedure as well as the alternatives and risks of each were explained to the patient.  Verbal consent for procedure obtained. Time Out: Verified patient identification, verified procedure, site was marked, verified correct patient position. The area was cleaned with alcohol swab.     The right greater trochanteric region was targeted for Extracorporeal shockwave therapy.    Preset: Greater Trochanteric Pain Syndrome/Bursitis Power Level: 90 mJ Frequency: 10 Hz Impulse/cycles: 3000 Head size: Regular   Patient tolerated procedure well without immediate complications.   -She will follow-up next week for additional shockwave treatment -She did tell me Dr. Lajoyce Corners thought they may need to go back in for surgery, I did give her information for Dr. Fonnie Birkenhead to discuss with Dr. Lajoyce Corners if a gluteus maximus transfer or medius/minimus repair would be warranted during this procedure  Madelyn Brunner, DO Primary Care Sports Medicine Physician  [facility] - Orthopedics  This note was dictated using Dragon naturally speaking software and may contain errors in syntax, spelling, or content which have not been identified prior to signing this note.

## 2023-05-29 ENCOUNTER — Other Ambulatory Visit: Payer: Self-pay | Admitting: Orthopedic Surgery

## 2023-05-29 ENCOUNTER — Encounter: Payer: Self-pay | Admitting: Orthopedic Surgery

## 2023-05-30 DIAGNOSIS — E049 Nontoxic goiter, unspecified: Secondary | ICD-10-CM | POA: Diagnosis not present

## 2023-05-30 DIAGNOSIS — G43719 Chronic migraine without aura, intractable, without status migrainosus: Secondary | ICD-10-CM | POA: Diagnosis not present

## 2023-05-30 DIAGNOSIS — E039 Hypothyroidism, unspecified: Secondary | ICD-10-CM | POA: Diagnosis not present

## 2023-06-01 ENCOUNTER — Ambulatory Visit: Payer: Self-pay | Admitting: Sports Medicine

## 2023-06-01 ENCOUNTER — Encounter: Payer: Self-pay | Admitting: Sports Medicine

## 2023-06-01 VITALS — BP 117/77 | HR 68 | Ht 69.0 in | Wt 177.0 lb

## 2023-06-01 DIAGNOSIS — S76011D Strain of muscle, fascia and tendon of right hip, subsequent encounter: Secondary | ICD-10-CM

## 2023-06-01 DIAGNOSIS — M25551 Pain in right hip: Secondary | ICD-10-CM

## 2023-06-01 NOTE — Progress Notes (Signed)
   Procedure Note  Patient: Patricia Adkins             Date of Birth: 28-Jul-1971           MRN: [id number]             Visit Date: 06/01/2023  [name] is a pleasant 52 year-old female who presents today for ECSWT #4.  She is noting some improvement. Did have some other medical issues she was dealing with this past week.   Procedures: Visit Diagnoses:  1. Tear of right gluteus medius tendon, subsequent encounter   2. Tear of right gluteus minimus tendon, subsequent encounter   3. Lateral pain of right hip    Procedure: ECSWT Indications:  Gluteus medius/minimus tear s/p repair and subsequent post-op infection (pain)   Procedure Details Consent: Risks of procedure as well as the alternatives and risks of each were explained to the patient.  Verbal consent for procedure obtained. Time Out: Verified patient identification, verified procedure, site was marked, verified correct patient position. The area was cleaned with alcohol swab.     The right greater trochanteric region was targeted for Extracorporeal shockwave therapy.    Preset: Greater Trochanteric Pain Syndrome/Bursitis Power Level: 100 mJ Frequency: 10 Hz Impulse/cycles: 3600 Head size: Regular   Patient tolerated procedure well without immediate complications.    -She will follow-up next week for additional shockwave treatment -Discussed we will plan for about 6 treatments and then after this reevaluate if she would like further treatments if finding benefit, or taking at least a 1 month holiday to see the cumulative benefit she is receiving  Madelyn Brunner, DO Primary Care Sports Medicine Physician  [facility] - Orthopedics  This note was dictated using Dragon naturally speaking software and may contain errors in syntax, spelling, or content which have not been identified prior to signing this note.

## 2023-06-07 ENCOUNTER — Encounter: Payer: Self-pay | Admitting: Orthopedic Surgery

## 2023-06-07 DIAGNOSIS — Z4789 Encounter for other orthopedic aftercare: Secondary | ICD-10-CM | POA: Diagnosis not present

## 2023-06-07 DIAGNOSIS — M25551 Pain in right hip: Secondary | ICD-10-CM | POA: Diagnosis not present

## 2023-06-08 ENCOUNTER — Encounter: Payer: Self-pay | Admitting: Sports Medicine

## 2023-06-08 ENCOUNTER — Ambulatory Visit: Payer: Self-pay | Admitting: Sports Medicine

## 2023-06-08 DIAGNOSIS — M25551 Pain in right hip: Secondary | ICD-10-CM

## 2023-06-08 DIAGNOSIS — S76011D Strain of muscle, fascia and tendon of right hip, subsequent encounter: Secondary | ICD-10-CM

## 2023-06-08 NOTE — Progress Notes (Signed)
   Procedure Note  Patient: Patricia Adkins             Date of Birth: 07-23-1971           MRN: [id number]             Visit Date: 06/08/2023  Nelva is a pleasant 52 year-old female who presents today for ECSWT #5.  She is noting some improvement, but not significantly improved yet - feels like she is about 40% improved. Discussing with Dr. Lajoyce Corners if there is a need for an additional I&D surgery.   Procedures: Visit Diagnoses:  1. Lateral pain of right hip   2. Tear of right gluteus medius tendon, subsequent encounter   3. Tear of right gluteus minimus tendon, subsequent encounter    Procedure: ECSWT Indications:  Gluteus medius/minimus tear s/p repair and subsequent post-op infection (pain)   Procedure Details Consent: Risks of procedure as well as the alternatives and risks of each were explained to the patient.  Verbal consent for procedure obtained. Time Out: Verified patient identification, verified procedure, site was marked, verified correct patient position. The area was cleaned with alcohol swab.     The right greater trochanteric region was targeted for Extracorporeal shockwave therapy.    Preset: Greater Trochanteric Pain Syndrome/Bursitis Power Level: 100 mJ Frequency: 11 Hz Impulse/cycles: 3600 Head size: Regular   Patient tolerated procedure well without immediate complications.   -We will hold on additional shockwave treatments at this time as she decides with Dr. Bosie Clos if they will proceed with surgical management.  She will notify me after decision is reached and we can consider looking additional treatments. - continue HEP and PT  Madelyn Brunner, DO Primary Care Sports Medicine Physician  [facility] - Orthopedics  This note was dictated using Dragon naturally speaking software and may contain errors in syntax, spelling, or content which have not been identified prior to signing this note.

## 2023-06-08 NOTE — Progress Notes (Signed)
Patient thinks that treatment is working some but not seeing huge improvements. She said she "wants it to be working"

## 2023-06-15 ENCOUNTER — Other Ambulatory Visit: Payer: Self-pay | Admitting: Oncology

## 2023-06-15 DIAGNOSIS — Z006 Encounter for examination for normal comparison and control in clinical research program: Secondary | ICD-10-CM

## 2023-07-16 ENCOUNTER — Encounter: Payer: Self-pay | Admitting: Orthopedic Surgery

## 2023-07-17 ENCOUNTER — Telehealth: Payer: Self-pay | Admitting: Sports Medicine

## 2023-07-17 ENCOUNTER — Telehealth: Payer: Self-pay | Admitting: Orthopedic Surgery

## 2023-07-17 ENCOUNTER — Other Ambulatory Visit: Payer: Self-pay | Admitting: Orthopedic Surgery

## 2023-07-17 MED ORDER — TRAMADOL HCL 50 MG PO TABS
50.0000 mg | ORAL_TABLET | Freq: Four times a day (QID) | ORAL | 0 refills | Status: AC | PRN
Start: 2023-07-17 — End: ?

## 2023-07-17 MED ORDER — HYDROCODONE-ACETAMINOPHEN 5-325 MG PO TABS
1.0000 | ORAL_TABLET | Freq: Four times a day (QID) | ORAL | 0 refills | Status: AC | PRN
Start: 2023-07-17 — End: ?

## 2023-07-17 NOTE — Telephone Encounter (Signed)
Patient called and said that the pain medication that he sent in she is allergic to. Which is Hydrocodone. Can you send something else? UV#[phone]

## 2023-07-17 NOTE — Telephone Encounter (Signed)
Received call from patient requesting copy of her medical records. I advised she needs to complete and sign authorization. Once that is received would call when ready. She stated she would come in when she had a chance. Pts ph [phone]

## 2023-07-17 NOTE — Telephone Encounter (Signed)
My chart message from the patient already sent to Dr. Lajoyce Corners to advise of pt's allergy and request different medication. Will sign off on this message and await advisement on original message.

## 2023-07-18 DIAGNOSIS — F5101 Primary insomnia: Secondary | ICD-10-CM | POA: Diagnosis not present

## 2023-07-18 DIAGNOSIS — F410 Panic disorder [episodic paroxysmal anxiety] without agoraphobia: Secondary | ICD-10-CM | POA: Diagnosis not present

## 2023-07-18 DIAGNOSIS — F3176 Bipolar disorder, in full remission, most recent episode depressed: Secondary | ICD-10-CM | POA: Diagnosis not present

## 2023-07-18 DIAGNOSIS — F3174 Bipolar disorder, in full remission, most recent episode manic: Secondary | ICD-10-CM | POA: Diagnosis not present

## 2023-07-20 ENCOUNTER — Encounter: Payer: Self-pay | Admitting: Orthopedic Surgery

## 2023-07-20 DIAGNOSIS — H903 Sensorineural hearing loss, bilateral: Secondary | ICD-10-CM | POA: Diagnosis not present

## 2023-07-24 DIAGNOSIS — R309 Painful micturition, unspecified: Secondary | ICD-10-CM | POA: Diagnosis not present

## 2023-07-24 DIAGNOSIS — R300 Dysuria: Secondary | ICD-10-CM | POA: Diagnosis not present

## 2023-07-25 ENCOUNTER — Other Ambulatory Visit (HOSPITAL_COMMUNITY)
Admission: RE | Admit: 2023-07-25 | Discharge: 2023-07-25 | Disposition: A | Discharge status: Home / Self Care | Payer: BC Managed Care – PPO | Source: Ambulatory Visit | Attending: Oncology | Admitting: Oncology

## 2023-07-25 DIAGNOSIS — Z006 Encounter for examination for normal comparison and control in clinical research program: Secondary | ICD-10-CM | POA: Insufficient documentation

## 2023-07-25 DIAGNOSIS — E039 Hypothyroidism, unspecified: Secondary | ICD-10-CM | POA: Diagnosis not present

## 2023-08-07 LAB — HELIX MOLECULAR SCREEN: Genetic Analysis Overall Interpretation: NEGATIVE

## 2023-08-09 DIAGNOSIS — M549 Dorsalgia, unspecified: Secondary | ICD-10-CM | POA: Diagnosis not present

## 2023-08-09 DIAGNOSIS — M25551 Pain in right hip: Secondary | ICD-10-CM | POA: Diagnosis not present

## 2023-08-09 DIAGNOSIS — B999 Unspecified infectious disease: Secondary | ICD-10-CM | POA: Diagnosis not present

## 2023-08-09 DIAGNOSIS — M25569 Pain in unspecified knee: Secondary | ICD-10-CM | POA: Diagnosis not present

## 2023-08-09 DIAGNOSIS — M2550 Pain in unspecified joint: Secondary | ICD-10-CM | POA: Diagnosis not present

## 2023-08-18 DIAGNOSIS — R7982 Elevated C-reactive protein (CRP): Secondary | ICD-10-CM | POA: Diagnosis not present

## 2023-08-18 DIAGNOSIS — E039 Hypothyroidism, unspecified: Secondary | ICD-10-CM | POA: Diagnosis not present

## 2023-08-18 DIAGNOSIS — E782 Mixed hyperlipidemia: Secondary | ICD-10-CM | POA: Diagnosis not present

## 2023-08-18 DIAGNOSIS — E790 Hyperuricemia without signs of inflammatory arthritis and tophaceous disease: Secondary | ICD-10-CM | POA: Diagnosis not present

## 2023-08-18 DIAGNOSIS — F319 Bipolar disorder, unspecified: Secondary | ICD-10-CM | POA: Diagnosis not present

## 2023-08-18 DIAGNOSIS — E063 Autoimmune thyroiditis: Secondary | ICD-10-CM | POA: Diagnosis not present

## 2023-08-24 DIAGNOSIS — S76011A Strain of muscle, fascia and tendon of right hip, initial encounter: Secondary | ICD-10-CM | POA: Diagnosis not present

## 2023-08-24 DIAGNOSIS — M86251 Subacute osteomyelitis, right femur: Secondary | ICD-10-CM | POA: Diagnosis not present

## 2023-08-24 DIAGNOSIS — X58XXXA Exposure to other specified factors, initial encounter: Secondary | ICD-10-CM | POA: Diagnosis not present

## 2023-08-24 DIAGNOSIS — M25551 Pain in right hip: Secondary | ICD-10-CM | POA: Diagnosis not present

## 2023-08-31 DIAGNOSIS — M791 Myalgia, unspecified site: Secondary | ICD-10-CM | POA: Diagnosis not present

## 2023-08-31 DIAGNOSIS — M542 Cervicalgia: Secondary | ICD-10-CM | POA: Diagnosis not present

## 2023-08-31 DIAGNOSIS — G518 Other disorders of facial nerve: Secondary | ICD-10-CM | POA: Diagnosis not present

## 2023-08-31 DIAGNOSIS — G43719 Chronic migraine without aura, intractable, without status migrainosus: Secondary | ICD-10-CM | POA: Diagnosis not present

## 2023-09-01 DIAGNOSIS — H1033 Unspecified acute conjunctivitis, bilateral: Secondary | ICD-10-CM | POA: Diagnosis not present

## 2023-09-01 DIAGNOSIS — Z03818 Encounter for observation for suspected exposure to other biological agents ruled out: Secondary | ICD-10-CM | POA: Diagnosis not present

## 2023-09-01 DIAGNOSIS — J020 Streptococcal pharyngitis: Secondary | ICD-10-CM | POA: Diagnosis not present

## 2023-09-09 DIAGNOSIS — R300 Dysuria: Secondary | ICD-10-CM | POA: Diagnosis not present

## 2023-09-14 DIAGNOSIS — A7749 Other ehrlichiosis: Secondary | ICD-10-CM | POA: Diagnosis not present

## 2023-09-14 DIAGNOSIS — B6000 Babesiosis, unspecified: Secondary | ICD-10-CM | POA: Diagnosis not present

## 2023-09-14 DIAGNOSIS — R5383 Other fatigue: Secondary | ICD-10-CM | POA: Diagnosis not present

## 2023-09-14 DIAGNOSIS — A6920 Lyme disease, unspecified: Secondary | ICD-10-CM | POA: Diagnosis not present

## 2023-09-18 DIAGNOSIS — J020 Streptococcal pharyngitis: Secondary | ICD-10-CM | POA: Diagnosis not present

## 2023-09-26 DIAGNOSIS — M869 Osteomyelitis, unspecified: Secondary | ICD-10-CM | POA: Diagnosis not present

## 2023-09-26 DIAGNOSIS — M86251 Subacute osteomyelitis, right femur: Secondary | ICD-10-CM | POA: Diagnosis not present

## 2023-09-26 DIAGNOSIS — K219 Gastro-esophageal reflux disease without esophagitis: Secondary | ICD-10-CM | POA: Diagnosis not present

## 2023-09-26 DIAGNOSIS — S76011A Strain of muscle, fascia and tendon of right hip, initial encounter: Secondary | ICD-10-CM | POA: Diagnosis not present

## 2023-09-26 DIAGNOSIS — Y838 Other surgical procedures as the cause of abnormal reaction of the patient, or of later complication, without mention of misadventure at the time of the procedure: Secondary | ICD-10-CM | POA: Diagnosis not present

## 2023-09-26 DIAGNOSIS — F32A Depression, unspecified: Secondary | ICD-10-CM | POA: Diagnosis not present

## 2023-09-26 DIAGNOSIS — T8149XA Infection following a procedure, other surgical site, initial encounter: Secondary | ICD-10-CM | POA: Diagnosis not present

## 2023-09-26 DIAGNOSIS — W1839XA Other fall on same level, initial encounter: Secondary | ICD-10-CM | POA: Diagnosis not present

## 2023-09-27 DIAGNOSIS — M542 Cervicalgia: Secondary | ICD-10-CM | POA: Diagnosis not present

## 2023-09-27 DIAGNOSIS — M791 Myalgia, unspecified site: Secondary | ICD-10-CM | POA: Diagnosis not present

## 2023-09-27 DIAGNOSIS — G518 Other disorders of facial nerve: Secondary | ICD-10-CM | POA: Diagnosis not present

## 2023-09-27 DIAGNOSIS — G43719 Chronic migraine without aura, intractable, without status migrainosus: Secondary | ICD-10-CM | POA: Diagnosis not present

## 2023-09-29 DIAGNOSIS — E039 Hypothyroidism, unspecified: Secondary | ICD-10-CM | POA: Diagnosis not present

## 2023-10-08 DIAGNOSIS — S8392XA Sprain of unspecified site of left knee, initial encounter: Secondary | ICD-10-CM | POA: Diagnosis not present

## 2023-10-09 DIAGNOSIS — Y838 Other surgical procedures as the cause of abnormal reaction of the patient, or of later complication, without mention of misadventure at the time of the procedure: Secondary | ICD-10-CM | POA: Diagnosis not present

## 2023-10-09 DIAGNOSIS — T8149XA Infection following a procedure, other surgical site, initial encounter: Secondary | ICD-10-CM | POA: Diagnosis not present

## 2023-10-10 DIAGNOSIS — M25562 Pain in left knee: Secondary | ICD-10-CM | POA: Diagnosis not present

## 2023-10-13 DIAGNOSIS — X58XXXA Exposure to other specified factors, initial encounter: Secondary | ICD-10-CM | POA: Diagnosis not present

## 2023-10-13 DIAGNOSIS — M25562 Pain in left knee: Secondary | ICD-10-CM | POA: Diagnosis not present

## 2023-10-13 DIAGNOSIS — S76011A Strain of muscle, fascia and tendon of right hip, initial encounter: Secondary | ICD-10-CM | POA: Diagnosis not present

## 2023-10-13 DIAGNOSIS — M86251 Subacute osteomyelitis, right femur: Secondary | ICD-10-CM | POA: Diagnosis not present

## 2023-10-19 DIAGNOSIS — F5101 Primary insomnia: Secondary | ICD-10-CM | POA: Diagnosis not present

## 2023-10-19 DIAGNOSIS — F410 Panic disorder [episodic paroxysmal anxiety] without agoraphobia: Secondary | ICD-10-CM | POA: Diagnosis not present

## 2023-10-19 DIAGNOSIS — F3174 Bipolar disorder, in full remission, most recent episode manic: Secondary | ICD-10-CM | POA: Diagnosis not present

## 2023-10-19 DIAGNOSIS — F3176 Bipolar disorder, in full remission, most recent episode depressed: Secondary | ICD-10-CM | POA: Diagnosis not present

## 2023-10-24 DIAGNOSIS — M25562 Pain in left knee: Secondary | ICD-10-CM | POA: Diagnosis not present

## 2023-11-06 DIAGNOSIS — S76011A Strain of muscle, fascia and tendon of right hip, initial encounter: Secondary | ICD-10-CM | POA: Diagnosis not present

## 2023-11-06 DIAGNOSIS — R600 Localized edema: Secondary | ICD-10-CM | POA: Diagnosis not present

## 2023-11-06 DIAGNOSIS — F3181 Bipolar II disorder: Secondary | ICD-10-CM | POA: Diagnosis not present

## 2023-11-06 DIAGNOSIS — M86351 Chronic multifocal osteomyelitis, right femur: Secondary | ICD-10-CM | POA: Diagnosis not present

## 2023-11-06 DIAGNOSIS — M86251 Subacute osteomyelitis, right femur: Secondary | ICD-10-CM | POA: Diagnosis not present

## 2023-11-06 DIAGNOSIS — E039 Hypothyroidism, unspecified: Secondary | ICD-10-CM | POA: Diagnosis not present

## 2023-11-06 DIAGNOSIS — E049 Nontoxic goiter, unspecified: Secondary | ICD-10-CM | POA: Diagnosis not present

## 2023-11-06 DIAGNOSIS — Z01818 Encounter for other preprocedural examination: Secondary | ICD-10-CM | POA: Diagnosis not present

## 2023-11-06 DIAGNOSIS — M7061 Trochanteric bursitis, right hip: Secondary | ICD-10-CM | POA: Diagnosis not present

## 2023-11-06 DIAGNOSIS — S76011D Strain of muscle, fascia and tendon of right hip, subsequent encounter: Secondary | ICD-10-CM | POA: Diagnosis not present

## 2023-11-06 DIAGNOSIS — D509 Iron deficiency anemia, unspecified: Secondary | ICD-10-CM | POA: Diagnosis not present

## 2023-11-06 DIAGNOSIS — X58XXXD Exposure to other specified factors, subsequent encounter: Secondary | ICD-10-CM | POA: Diagnosis not present

## 2023-11-06 DIAGNOSIS — X58XXXA Exposure to other specified factors, initial encounter: Secondary | ICD-10-CM | POA: Diagnosis not present

## 2023-11-06 DIAGNOSIS — R002 Palpitations: Secondary | ICD-10-CM | POA: Diagnosis not present

## 2023-11-07 DIAGNOSIS — G43719 Chronic migraine without aura, intractable, without status migrainosus: Secondary | ICD-10-CM | POA: Diagnosis not present

## 2023-11-07 DIAGNOSIS — G518 Other disorders of facial nerve: Secondary | ICD-10-CM | POA: Diagnosis not present

## 2023-11-07 DIAGNOSIS — M791 Myalgia, unspecified site: Secondary | ICD-10-CM | POA: Diagnosis not present

## 2023-11-07 DIAGNOSIS — M542 Cervicalgia: Secondary | ICD-10-CM | POA: Diagnosis not present

## 2023-11-13 DIAGNOSIS — E039 Hypothyroidism, unspecified: Secondary | ICD-10-CM | POA: Diagnosis not present

## 2023-11-13 DIAGNOSIS — F3181 Bipolar II disorder: Secondary | ICD-10-CM | POA: Diagnosis not present

## 2023-11-13 DIAGNOSIS — S76011A Strain of muscle, fascia and tendon of right hip, initial encounter: Secondary | ICD-10-CM | POA: Diagnosis not present

## 2023-11-13 DIAGNOSIS — R9431 Abnormal electrocardiogram [ECG] [EKG]: Secondary | ICD-10-CM | POA: Diagnosis not present

## 2023-11-13 DIAGNOSIS — G43909 Migraine, unspecified, not intractable, without status migrainosus: Secondary | ICD-10-CM | POA: Diagnosis not present

## 2023-11-13 DIAGNOSIS — M86351 Chronic multifocal osteomyelitis, right femur: Secondary | ICD-10-CM | POA: Diagnosis not present

## 2023-11-13 DIAGNOSIS — Z886 Allergy status to analgesic agent status: Secondary | ICD-10-CM | POA: Diagnosis not present

## 2023-11-13 DIAGNOSIS — Z882 Allergy status to sulfonamides status: Secondary | ICD-10-CM | POA: Diagnosis not present

## 2023-11-13 DIAGNOSIS — E049 Nontoxic goiter, unspecified: Secondary | ICD-10-CM | POA: Diagnosis not present

## 2023-11-13 DIAGNOSIS — Z79899 Other long term (current) drug therapy: Secondary | ICD-10-CM | POA: Diagnosis not present

## 2023-11-13 DIAGNOSIS — Z91011 Allergy to milk products: Secondary | ICD-10-CM | POA: Diagnosis not present

## 2023-11-13 DIAGNOSIS — X58XXXA Exposure to other specified factors, initial encounter: Secondary | ICD-10-CM | POA: Diagnosis not present

## 2023-11-13 DIAGNOSIS — M7061 Trochanteric bursitis, right hip: Secondary | ICD-10-CM | POA: Diagnosis not present

## 2023-11-13 DIAGNOSIS — Z91018 Allergy to other foods: Secondary | ICD-10-CM | POA: Diagnosis not present

## 2023-11-13 DIAGNOSIS — Z91048 Other nonmedicinal substance allergy status: Secondary | ICD-10-CM | POA: Diagnosis not present

## 2023-11-13 DIAGNOSIS — Z888 Allergy status to other drugs, medicaments and biological substances status: Secondary | ICD-10-CM | POA: Diagnosis not present

## 2023-11-18 DIAGNOSIS — R600 Localized edema: Secondary | ICD-10-CM | POA: Diagnosis not present

## 2023-11-18 DIAGNOSIS — Z7901 Long term (current) use of anticoagulants: Secondary | ICD-10-CM | POA: Diagnosis not present

## 2023-11-18 DIAGNOSIS — Z79899 Other long term (current) drug therapy: Secondary | ICD-10-CM | POA: Diagnosis not present

## 2023-11-18 DIAGNOSIS — M86351 Chronic multifocal osteomyelitis, right femur: Secondary | ICD-10-CM | POA: Diagnosis not present

## 2023-11-18 DIAGNOSIS — Z9181 History of falling: Secondary | ICD-10-CM | POA: Diagnosis not present

## 2023-11-18 DIAGNOSIS — Z556 Problems related to health literacy: Secondary | ICD-10-CM | POA: Diagnosis not present

## 2023-11-18 DIAGNOSIS — Z604 Social exclusion and rejection: Secondary | ICD-10-CM | POA: Diagnosis not present

## 2023-11-18 DIAGNOSIS — E049 Nontoxic goiter, unspecified: Secondary | ICD-10-CM | POA: Diagnosis not present

## 2023-11-18 DIAGNOSIS — Z7982 Long term (current) use of aspirin: Secondary | ICD-10-CM | POA: Diagnosis not present

## 2023-11-18 DIAGNOSIS — E039 Hypothyroidism, unspecified: Secondary | ICD-10-CM | POA: Diagnosis not present

## 2023-11-18 DIAGNOSIS — F319 Bipolar disorder, unspecified: Secondary | ICD-10-CM | POA: Diagnosis not present

## 2023-11-18 DIAGNOSIS — D649 Anemia, unspecified: Secondary | ICD-10-CM | POA: Diagnosis not present

## 2023-11-18 DIAGNOSIS — S76811D Strain of other specified muscles, fascia and tendons at thigh level, right thigh, subsequent encounter: Secondary | ICD-10-CM | POA: Diagnosis not present

## 2023-11-21 DIAGNOSIS — Z604 Social exclusion and rejection: Secondary | ICD-10-CM | POA: Diagnosis not present

## 2023-11-21 DIAGNOSIS — S76811D Strain of other specified muscles, fascia and tendons at thigh level, right thigh, subsequent encounter: Secondary | ICD-10-CM | POA: Diagnosis not present

## 2023-11-21 DIAGNOSIS — R600 Localized edema: Secondary | ICD-10-CM | POA: Diagnosis not present

## 2023-11-21 DIAGNOSIS — D649 Anemia, unspecified: Secondary | ICD-10-CM | POA: Diagnosis not present

## 2023-11-21 DIAGNOSIS — E049 Nontoxic goiter, unspecified: Secondary | ICD-10-CM | POA: Diagnosis not present

## 2023-11-21 DIAGNOSIS — M86351 Chronic multifocal osteomyelitis, right femur: Secondary | ICD-10-CM | POA: Diagnosis not present

## 2023-11-21 DIAGNOSIS — Z7901 Long term (current) use of anticoagulants: Secondary | ICD-10-CM | POA: Diagnosis not present

## 2023-11-21 DIAGNOSIS — Z79899 Other long term (current) drug therapy: Secondary | ICD-10-CM | POA: Diagnosis not present

## 2023-11-21 DIAGNOSIS — E039 Hypothyroidism, unspecified: Secondary | ICD-10-CM | POA: Diagnosis not present

## 2023-11-21 DIAGNOSIS — Z9181 History of falling: Secondary | ICD-10-CM | POA: Diagnosis not present

## 2023-11-21 DIAGNOSIS — Z7982 Long term (current) use of aspirin: Secondary | ICD-10-CM | POA: Diagnosis not present

## 2023-11-21 DIAGNOSIS — F319 Bipolar disorder, unspecified: Secondary | ICD-10-CM | POA: Diagnosis not present

## 2023-11-21 DIAGNOSIS — Z556 Problems related to health literacy: Secondary | ICD-10-CM | POA: Diagnosis not present

## 2023-11-23 DIAGNOSIS — M86351 Chronic multifocal osteomyelitis, right femur: Secondary | ICD-10-CM | POA: Diagnosis not present

## 2023-11-23 DIAGNOSIS — Z604 Social exclusion and rejection: Secondary | ICD-10-CM | POA: Diagnosis not present

## 2023-11-23 DIAGNOSIS — Z9181 History of falling: Secondary | ICD-10-CM | POA: Diagnosis not present

## 2023-11-23 DIAGNOSIS — E049 Nontoxic goiter, unspecified: Secondary | ICD-10-CM | POA: Diagnosis not present

## 2023-11-23 DIAGNOSIS — Z556 Problems related to health literacy: Secondary | ICD-10-CM | POA: Diagnosis not present

## 2023-11-23 DIAGNOSIS — Z79899 Other long term (current) drug therapy: Secondary | ICD-10-CM | POA: Diagnosis not present

## 2023-11-23 DIAGNOSIS — F319 Bipolar disorder, unspecified: Secondary | ICD-10-CM | POA: Diagnosis not present

## 2023-11-23 DIAGNOSIS — Z7982 Long term (current) use of aspirin: Secondary | ICD-10-CM | POA: Diagnosis not present

## 2023-11-23 DIAGNOSIS — R600 Localized edema: Secondary | ICD-10-CM | POA: Diagnosis not present

## 2023-11-23 DIAGNOSIS — S76811D Strain of other specified muscles, fascia and tendons at thigh level, right thigh, subsequent encounter: Secondary | ICD-10-CM | POA: Diagnosis not present

## 2023-11-23 DIAGNOSIS — E039 Hypothyroidism, unspecified: Secondary | ICD-10-CM | POA: Diagnosis not present

## 2023-11-23 DIAGNOSIS — Z7901 Long term (current) use of anticoagulants: Secondary | ICD-10-CM | POA: Diagnosis not present

## 2023-11-23 DIAGNOSIS — D649 Anemia, unspecified: Secondary | ICD-10-CM | POA: Diagnosis not present

## 2023-11-28 DIAGNOSIS — S76011A Strain of muscle, fascia and tendon of right hip, initial encounter: Secondary | ICD-10-CM | POA: Diagnosis not present

## 2023-11-28 DIAGNOSIS — M86251 Subacute osteomyelitis, right femur: Secondary | ICD-10-CM | POA: Diagnosis not present

## 2023-11-28 DIAGNOSIS — M86351 Chronic multifocal osteomyelitis, right femur: Secondary | ICD-10-CM | POA: Diagnosis not present

## 2023-11-28 DIAGNOSIS — Z9181 History of falling: Secondary | ICD-10-CM | POA: Diagnosis not present

## 2023-11-28 DIAGNOSIS — Z604 Social exclusion and rejection: Secondary | ICD-10-CM | POA: Diagnosis not present

## 2023-11-28 DIAGNOSIS — T8149XA Infection following a procedure, other surgical site, initial encounter: Secondary | ICD-10-CM | POA: Diagnosis not present

## 2023-11-28 DIAGNOSIS — Z7982 Long term (current) use of aspirin: Secondary | ICD-10-CM | POA: Diagnosis not present

## 2023-11-28 DIAGNOSIS — E039 Hypothyroidism, unspecified: Secondary | ICD-10-CM | POA: Diagnosis not present

## 2023-11-28 DIAGNOSIS — E049 Nontoxic goiter, unspecified: Secondary | ICD-10-CM | POA: Diagnosis not present

## 2023-11-28 DIAGNOSIS — Z79899 Other long term (current) drug therapy: Secondary | ICD-10-CM | POA: Diagnosis not present

## 2023-11-28 DIAGNOSIS — Z7901 Long term (current) use of anticoagulants: Secondary | ICD-10-CM | POA: Diagnosis not present

## 2023-11-28 DIAGNOSIS — F319 Bipolar disorder, unspecified: Secondary | ICD-10-CM | POA: Diagnosis not present

## 2023-11-28 DIAGNOSIS — S76811D Strain of other specified muscles, fascia and tendons at thigh level, right thigh, subsequent encounter: Secondary | ICD-10-CM | POA: Diagnosis not present

## 2023-11-28 DIAGNOSIS — D649 Anemia, unspecified: Secondary | ICD-10-CM | POA: Diagnosis not present

## 2023-11-28 DIAGNOSIS — R600 Localized edema: Secondary | ICD-10-CM | POA: Diagnosis not present

## 2023-11-28 DIAGNOSIS — Z556 Problems related to health literacy: Secondary | ICD-10-CM | POA: Diagnosis not present

## 2023-12-18 DIAGNOSIS — F3174 Bipolar disorder, in full remission, most recent episode manic: Secondary | ICD-10-CM | POA: Diagnosis not present

## 2023-12-18 DIAGNOSIS — F5101 Primary insomnia: Secondary | ICD-10-CM | POA: Diagnosis not present

## 2023-12-18 DIAGNOSIS — F3176 Bipolar disorder, in full remission, most recent episode depressed: Secondary | ICD-10-CM | POA: Diagnosis not present

## 2023-12-18 DIAGNOSIS — F4322 Adjustment disorder with anxiety: Secondary | ICD-10-CM | POA: Diagnosis not present

## 2024-01-11 DIAGNOSIS — Z0000 Encounter for general adult medical examination without abnormal findings: Secondary | ICD-10-CM | POA: Diagnosis not present

## 2024-01-11 DIAGNOSIS — Z13220 Encounter for screening for lipoid disorders: Secondary | ICD-10-CM | POA: Diagnosis not present

## 2024-01-11 DIAGNOSIS — M9901 Segmental and somatic dysfunction of cervical region: Secondary | ICD-10-CM | POA: Diagnosis not present

## 2024-01-11 DIAGNOSIS — F319 Bipolar disorder, unspecified: Secondary | ICD-10-CM | POA: Diagnosis not present

## 2024-01-11 DIAGNOSIS — Z131 Encounter for screening for diabetes mellitus: Secondary | ICD-10-CM | POA: Diagnosis not present

## 2024-01-11 DIAGNOSIS — H9313 Tinnitus, bilateral: Secondary | ICD-10-CM | POA: Diagnosis not present

## 2024-01-11 DIAGNOSIS — G43909 Migraine, unspecified, not intractable, without status migrainosus: Secondary | ICD-10-CM | POA: Diagnosis not present

## 2024-01-12 DIAGNOSIS — Z556 Problems related to health literacy: Secondary | ICD-10-CM | POA: Diagnosis not present

## 2024-01-12 DIAGNOSIS — E039 Hypothyroidism, unspecified: Secondary | ICD-10-CM | POA: Diagnosis not present

## 2024-01-12 DIAGNOSIS — S76811D Strain of other specified muscles, fascia and tendons at thigh level, right thigh, subsequent encounter: Secondary | ICD-10-CM | POA: Diagnosis not present

## 2024-01-12 DIAGNOSIS — F3181 Bipolar II disorder: Secondary | ICD-10-CM | POA: Diagnosis not present

## 2024-01-12 DIAGNOSIS — D509 Iron deficiency anemia, unspecified: Secondary | ICD-10-CM | POA: Diagnosis not present

## 2024-01-12 DIAGNOSIS — Z604 Social exclusion and rejection: Secondary | ICD-10-CM | POA: Diagnosis not present

## 2024-01-12 DIAGNOSIS — G43909 Migraine, unspecified, not intractable, without status migrainosus: Secondary | ICD-10-CM | POA: Diagnosis not present

## 2024-01-12 DIAGNOSIS — F329 Major depressive disorder, single episode, unspecified: Secondary | ICD-10-CM | POA: Diagnosis not present

## 2024-01-12 DIAGNOSIS — Z79899 Other long term (current) drug therapy: Secondary | ICD-10-CM | POA: Diagnosis not present

## 2024-01-12 DIAGNOSIS — K219 Gastro-esophageal reflux disease without esophagitis: Secondary | ICD-10-CM | POA: Diagnosis not present

## 2024-01-12 DIAGNOSIS — E049 Nontoxic goiter, unspecified: Secondary | ICD-10-CM | POA: Diagnosis not present

## 2024-01-12 DIAGNOSIS — M1990 Unspecified osteoarthritis, unspecified site: Secondary | ICD-10-CM | POA: Diagnosis not present

## 2024-01-12 DIAGNOSIS — R0781 Pleurodynia: Secondary | ICD-10-CM | POA: Diagnosis not present

## 2024-01-12 DIAGNOSIS — S76011D Strain of muscle, fascia and tendon of right hip, subsequent encounter: Secondary | ICD-10-CM | POA: Diagnosis not present

## 2024-01-18 DIAGNOSIS — S76811D Strain of other specified muscles, fascia and tendons at thigh level, right thigh, subsequent encounter: Secondary | ICD-10-CM | POA: Diagnosis not present

## 2024-01-18 DIAGNOSIS — Z556 Problems related to health literacy: Secondary | ICD-10-CM | POA: Diagnosis not present

## 2024-01-18 DIAGNOSIS — F329 Major depressive disorder, single episode, unspecified: Secondary | ICD-10-CM | POA: Diagnosis not present

## 2024-01-18 DIAGNOSIS — M1990 Unspecified osteoarthritis, unspecified site: Secondary | ICD-10-CM | POA: Diagnosis not present

## 2024-01-18 DIAGNOSIS — S76011D Strain of muscle, fascia and tendon of right hip, subsequent encounter: Secondary | ICD-10-CM | POA: Diagnosis not present

## 2024-01-18 DIAGNOSIS — E039 Hypothyroidism, unspecified: Secondary | ICD-10-CM | POA: Diagnosis not present

## 2024-01-18 DIAGNOSIS — F5101 Primary insomnia: Secondary | ICD-10-CM | POA: Diagnosis not present

## 2024-01-18 DIAGNOSIS — F3181 Bipolar II disorder: Secondary | ICD-10-CM | POA: Diagnosis not present

## 2024-01-18 DIAGNOSIS — Z604 Social exclusion and rejection: Secondary | ICD-10-CM | POA: Diagnosis not present

## 2024-01-18 DIAGNOSIS — E049 Nontoxic goiter, unspecified: Secondary | ICD-10-CM | POA: Diagnosis not present

## 2024-01-18 DIAGNOSIS — Z79899 Other long term (current) drug therapy: Secondary | ICD-10-CM | POA: Diagnosis not present

## 2024-01-18 DIAGNOSIS — D509 Iron deficiency anemia, unspecified: Secondary | ICD-10-CM | POA: Diagnosis not present

## 2024-01-18 DIAGNOSIS — F3176 Bipolar disorder, in full remission, most recent episode depressed: Secondary | ICD-10-CM | POA: Diagnosis not present

## 2024-01-18 DIAGNOSIS — G43909 Migraine, unspecified, not intractable, without status migrainosus: Secondary | ICD-10-CM | POA: Diagnosis not present

## 2024-01-18 DIAGNOSIS — F3174 Bipolar disorder, in full remission, most recent episode manic: Secondary | ICD-10-CM | POA: Diagnosis not present

## 2024-01-18 DIAGNOSIS — F411 Generalized anxiety disorder: Secondary | ICD-10-CM | POA: Diagnosis not present

## 2024-01-18 DIAGNOSIS — K219 Gastro-esophageal reflux disease without esophagitis: Secondary | ICD-10-CM | POA: Diagnosis not present

## 2024-01-24 DIAGNOSIS — Z556 Problems related to health literacy: Secondary | ICD-10-CM | POA: Diagnosis not present

## 2024-01-24 DIAGNOSIS — E049 Nontoxic goiter, unspecified: Secondary | ICD-10-CM | POA: Diagnosis not present

## 2024-01-24 DIAGNOSIS — Z604 Social exclusion and rejection: Secondary | ICD-10-CM | POA: Diagnosis not present

## 2024-01-24 DIAGNOSIS — F3181 Bipolar II disorder: Secondary | ICD-10-CM | POA: Diagnosis not present

## 2024-01-24 DIAGNOSIS — S76011D Strain of muscle, fascia and tendon of right hip, subsequent encounter: Secondary | ICD-10-CM | POA: Diagnosis not present

## 2024-01-24 DIAGNOSIS — G43909 Migraine, unspecified, not intractable, without status migrainosus: Secondary | ICD-10-CM | POA: Diagnosis not present

## 2024-01-24 DIAGNOSIS — E039 Hypothyroidism, unspecified: Secondary | ICD-10-CM | POA: Diagnosis not present

## 2024-01-24 DIAGNOSIS — Z79899 Other long term (current) drug therapy: Secondary | ICD-10-CM | POA: Diagnosis not present

## 2024-01-24 DIAGNOSIS — F329 Major depressive disorder, single episode, unspecified: Secondary | ICD-10-CM | POA: Diagnosis not present

## 2024-01-24 DIAGNOSIS — M1990 Unspecified osteoarthritis, unspecified site: Secondary | ICD-10-CM | POA: Diagnosis not present

## 2024-01-24 DIAGNOSIS — K219 Gastro-esophageal reflux disease without esophagitis: Secondary | ICD-10-CM | POA: Diagnosis not present

## 2024-01-24 DIAGNOSIS — S76811D Strain of other specified muscles, fascia and tendons at thigh level, right thigh, subsequent encounter: Secondary | ICD-10-CM | POA: Diagnosis not present

## 2024-01-24 DIAGNOSIS — D509 Iron deficiency anemia, unspecified: Secondary | ICD-10-CM | POA: Diagnosis not present

## 2024-02-06 DIAGNOSIS — M25551 Pain in right hip: Secondary | ICD-10-CM | POA: Diagnosis not present

## 2024-02-06 DIAGNOSIS — Z4789 Encounter for other orthopedic aftercare: Secondary | ICD-10-CM | POA: Diagnosis not present

## 2024-02-07 DIAGNOSIS — G43719 Chronic migraine without aura, intractable, without status migrainosus: Secondary | ICD-10-CM | POA: Diagnosis not present

## 2024-02-07 DIAGNOSIS — M542 Cervicalgia: Secondary | ICD-10-CM | POA: Diagnosis not present

## 2024-02-07 DIAGNOSIS — M791 Myalgia, unspecified site: Secondary | ICD-10-CM | POA: Diagnosis not present

## 2024-02-07 DIAGNOSIS — G518 Other disorders of facial nerve: Secondary | ICD-10-CM | POA: Diagnosis not present

## 2024-02-12 DIAGNOSIS — M25551 Pain in right hip: Secondary | ICD-10-CM | POA: Diagnosis not present

## 2024-02-12 DIAGNOSIS — Z4789 Encounter for other orthopedic aftercare: Secondary | ICD-10-CM | POA: Diagnosis not present

## 2024-02-14 DIAGNOSIS — F3181 Bipolar II disorder: Secondary | ICD-10-CM | POA: Diagnosis not present

## 2024-02-16 DIAGNOSIS — M25551 Pain in right hip: Secondary | ICD-10-CM | POA: Diagnosis not present

## 2024-02-16 DIAGNOSIS — Z4789 Encounter for other orthopedic aftercare: Secondary | ICD-10-CM | POA: Diagnosis not present

## 2024-02-20 DIAGNOSIS — M25551 Pain in right hip: Secondary | ICD-10-CM | POA: Diagnosis not present

## 2024-02-20 DIAGNOSIS — Z4789 Encounter for other orthopedic aftercare: Secondary | ICD-10-CM | POA: Diagnosis not present

## 2024-02-21 DIAGNOSIS — F5101 Primary insomnia: Secondary | ICD-10-CM | POA: Diagnosis not present

## 2024-02-21 DIAGNOSIS — F411 Generalized anxiety disorder: Secondary | ICD-10-CM | POA: Diagnosis not present

## 2024-02-21 DIAGNOSIS — F3174 Bipolar disorder, in full remission, most recent episode manic: Secondary | ICD-10-CM | POA: Diagnosis not present

## 2024-02-21 DIAGNOSIS — F3176 Bipolar disorder, in full remission, most recent episode depressed: Secondary | ICD-10-CM | POA: Diagnosis not present

## 2024-02-21 DIAGNOSIS — F3181 Bipolar II disorder: Secondary | ICD-10-CM | POA: Diagnosis not present

## 2024-02-22 DIAGNOSIS — M25551 Pain in right hip: Secondary | ICD-10-CM | POA: Diagnosis not present

## 2024-02-22 DIAGNOSIS — Z4789 Encounter for other orthopedic aftercare: Secondary | ICD-10-CM | POA: Diagnosis not present

## 2024-02-26 DIAGNOSIS — M25551 Pain in right hip: Secondary | ICD-10-CM | POA: Diagnosis not present

## 2024-02-26 DIAGNOSIS — Z4789 Encounter for other orthopedic aftercare: Secondary | ICD-10-CM | POA: Diagnosis not present

## 2024-02-28 DIAGNOSIS — H9313 Tinnitus, bilateral: Secondary | ICD-10-CM | POA: Diagnosis not present

## 2024-03-01 DIAGNOSIS — R601 Generalized edema: Secondary | ICD-10-CM | POA: Diagnosis not present

## 2024-03-01 DIAGNOSIS — Z6826 Body mass index (BMI) 26.0-26.9, adult: Secondary | ICD-10-CM | POA: Diagnosis not present

## 2024-03-01 DIAGNOSIS — F411 Generalized anxiety disorder: Secondary | ICD-10-CM | POA: Diagnosis not present

## 2024-03-07 DIAGNOSIS — M160 Bilateral primary osteoarthritis of hip: Secondary | ICD-10-CM | POA: Diagnosis not present

## 2024-03-07 DIAGNOSIS — M47816 Spondylosis without myelopathy or radiculopathy, lumbar region: Secondary | ICD-10-CM | POA: Diagnosis not present

## 2024-03-07 DIAGNOSIS — S76011A Strain of muscle, fascia and tendon of right hip, initial encounter: Secondary | ICD-10-CM | POA: Diagnosis not present

## 2024-03-07 DIAGNOSIS — M25552 Pain in left hip: Secondary | ICD-10-CM | POA: Diagnosis not present

## 2024-03-07 DIAGNOSIS — X500XXA Overexertion from strenuous movement or load, initial encounter: Secondary | ICD-10-CM | POA: Diagnosis not present

## 2024-03-07 DIAGNOSIS — F3181 Bipolar II disorder: Secondary | ICD-10-CM | POA: Diagnosis not present

## 2024-03-14 DIAGNOSIS — Z4789 Encounter for other orthopedic aftercare: Secondary | ICD-10-CM | POA: Diagnosis not present

## 2024-03-14 DIAGNOSIS — M25551 Pain in right hip: Secondary | ICD-10-CM | POA: Diagnosis not present

## 2024-03-15 DIAGNOSIS — F3181 Bipolar II disorder: Secondary | ICD-10-CM | POA: Diagnosis not present

## 2024-03-19 DIAGNOSIS — G43719 Chronic migraine without aura, intractable, without status migrainosus: Secondary | ICD-10-CM | POA: Diagnosis not present

## 2024-03-19 DIAGNOSIS — M791 Myalgia, unspecified site: Secondary | ICD-10-CM | POA: Diagnosis not present

## 2024-03-19 DIAGNOSIS — M542 Cervicalgia: Secondary | ICD-10-CM | POA: Diagnosis not present

## 2024-03-19 DIAGNOSIS — G518 Other disorders of facial nerve: Secondary | ICD-10-CM | POA: Diagnosis not present

## 2024-03-20 DIAGNOSIS — F3181 Bipolar II disorder: Secondary | ICD-10-CM | POA: Diagnosis not present

## 2024-03-22 DIAGNOSIS — G43009 Migraine without aura, not intractable, without status migrainosus: Secondary | ICD-10-CM | POA: Diagnosis not present

## 2024-03-25 DIAGNOSIS — E039 Hypothyroidism, unspecified: Secondary | ICD-10-CM | POA: Diagnosis not present

## 2024-03-29 DIAGNOSIS — E039 Hypothyroidism, unspecified: Secondary | ICD-10-CM | POA: Diagnosis not present

## 2024-04-01 DIAGNOSIS — Z01419 Encounter for gynecological examination (general) (routine) without abnormal findings: Secondary | ICD-10-CM | POA: Diagnosis not present

## 2024-04-01 DIAGNOSIS — Z6826 Body mass index (BMI) 26.0-26.9, adult: Secondary | ICD-10-CM | POA: Diagnosis not present

## 2024-04-01 DIAGNOSIS — Z1231 Encounter for screening mammogram for malignant neoplasm of breast: Secondary | ICD-10-CM | POA: Diagnosis not present

## 2024-04-10 DIAGNOSIS — F3181 Bipolar II disorder: Secondary | ICD-10-CM | POA: Diagnosis not present

## 2024-04-11 DIAGNOSIS — M2242 Chondromalacia patellae, left knee: Secondary | ICD-10-CM | POA: Diagnosis not present

## 2024-04-15 DIAGNOSIS — F3181 Bipolar II disorder: Secondary | ICD-10-CM | POA: Diagnosis not present

## 2024-04-17 DIAGNOSIS — F3181 Bipolar II disorder: Secondary | ICD-10-CM | POA: Diagnosis not present

## 2024-04-18 DIAGNOSIS — H9313 Tinnitus, bilateral: Secondary | ICD-10-CM | POA: Diagnosis not present

## 2024-04-18 DIAGNOSIS — H93293 Other abnormal auditory perceptions, bilateral: Secondary | ICD-10-CM | POA: Diagnosis not present

## 2024-04-22 DIAGNOSIS — R9431 Abnormal electrocardiogram [ECG] [EKG]: Secondary | ICD-10-CM | POA: Diagnosis not present

## 2024-04-22 DIAGNOSIS — E039 Hypothyroidism, unspecified: Secondary | ICD-10-CM | POA: Diagnosis not present

## 2024-04-22 DIAGNOSIS — G43701 Chronic migraine without aura, not intractable, with status migrainosus: Secondary | ICD-10-CM | POA: Diagnosis not present

## 2024-04-22 DIAGNOSIS — Z1330 Encounter for screening examination for mental health and behavioral disorders, unspecified: Secondary | ICD-10-CM | POA: Diagnosis not present

## 2024-04-24 DIAGNOSIS — G43701 Chronic migraine without aura, not intractable, with status migrainosus: Secondary | ICD-10-CM | POA: Diagnosis not present

## 2024-04-24 DIAGNOSIS — E039 Hypothyroidism, unspecified: Secondary | ICD-10-CM | POA: Diagnosis not present

## 2024-04-24 DIAGNOSIS — R9431 Abnormal electrocardiogram [ECG] [EKG]: Secondary | ICD-10-CM | POA: Diagnosis not present

## 2024-04-25 DIAGNOSIS — F3181 Bipolar II disorder: Secondary | ICD-10-CM | POA: Diagnosis not present

## 2024-05-07 DIAGNOSIS — E039 Hypothyroidism, unspecified: Secondary | ICD-10-CM | POA: Diagnosis not present

## 2024-05-07 DIAGNOSIS — R9431 Abnormal electrocardiogram [ECG] [EKG]: Secondary | ICD-10-CM | POA: Diagnosis not present

## 2024-05-07 DIAGNOSIS — G43701 Chronic migraine without aura, not intractable, with status migrainosus: Secondary | ICD-10-CM | POA: Diagnosis not present

## 2024-05-08 DIAGNOSIS — G518 Other disorders of facial nerve: Secondary | ICD-10-CM | POA: Diagnosis not present

## 2024-05-08 DIAGNOSIS — G43719 Chronic migraine without aura, intractable, without status migrainosus: Secondary | ICD-10-CM | POA: Diagnosis not present

## 2024-05-08 DIAGNOSIS — M791 Myalgia, unspecified site: Secondary | ICD-10-CM | POA: Diagnosis not present

## 2024-05-08 DIAGNOSIS — M542 Cervicalgia: Secondary | ICD-10-CM | POA: Diagnosis not present

## 2024-05-09 DIAGNOSIS — F3181 Bipolar II disorder: Secondary | ICD-10-CM | POA: Diagnosis not present

## 2024-05-17 DIAGNOSIS — R110 Nausea: Secondary | ICD-10-CM | POA: Diagnosis not present

## 2024-05-17 DIAGNOSIS — R109 Unspecified abdominal pain: Secondary | ICD-10-CM | POA: Diagnosis not present

## 2024-05-17 DIAGNOSIS — R197 Diarrhea, unspecified: Secondary | ICD-10-CM | POA: Diagnosis not present

## 2024-05-21 DIAGNOSIS — D899 Disorder involving the immune mechanism, unspecified: Secondary | ICD-10-CM | POA: Diagnosis not present

## 2024-05-21 DIAGNOSIS — K759 Inflammatory liver disease, unspecified: Secondary | ICD-10-CM | POA: Diagnosis not present

## 2024-05-21 DIAGNOSIS — R197 Diarrhea, unspecified: Secondary | ICD-10-CM | POA: Diagnosis not present

## 2024-05-21 DIAGNOSIS — R799 Abnormal finding of blood chemistry, unspecified: Secondary | ICD-10-CM | POA: Diagnosis not present

## 2024-05-30 DIAGNOSIS — M25551 Pain in right hip: Secondary | ICD-10-CM | POA: Diagnosis not present

## 2024-06-06 DIAGNOSIS — R9431 Abnormal electrocardiogram [ECG] [EKG]: Secondary | ICD-10-CM | POA: Diagnosis not present

## 2024-06-06 DIAGNOSIS — R002 Palpitations: Secondary | ICD-10-CM | POA: Diagnosis not present

## 2024-06-07 DIAGNOSIS — F3181 Bipolar II disorder: Secondary | ICD-10-CM | POA: Diagnosis not present

## 2024-06-28 DIAGNOSIS — F3176 Bipolar disorder, in full remission, most recent episode depressed: Secondary | ICD-10-CM | POA: Diagnosis not present

## 2024-06-28 DIAGNOSIS — F411 Generalized anxiety disorder: Secondary | ICD-10-CM | POA: Diagnosis not present

## 2024-06-28 DIAGNOSIS — F3174 Bipolar disorder, in full remission, most recent episode manic: Secondary | ICD-10-CM | POA: Diagnosis not present

## 2024-06-28 DIAGNOSIS — F5101 Primary insomnia: Secondary | ICD-10-CM | POA: Diagnosis not present

## 2024-07-24 DIAGNOSIS — F3181 Bipolar II disorder: Secondary | ICD-10-CM | POA: Diagnosis not present

## 2024-08-02 DIAGNOSIS — H43812 Vitreous degeneration, left eye: Secondary | ICD-10-CM | POA: Diagnosis not present

## 2024-08-02 DIAGNOSIS — H16223 Keratoconjunctivitis sicca, not specified as Sjogren's, bilateral: Secondary | ICD-10-CM | POA: Diagnosis not present

## 2024-08-07 DIAGNOSIS — M542 Cervicalgia: Secondary | ICD-10-CM | POA: Diagnosis not present

## 2024-08-07 DIAGNOSIS — G518 Other disorders of facial nerve: Secondary | ICD-10-CM | POA: Diagnosis not present

## 2024-08-07 DIAGNOSIS — G43719 Chronic migraine without aura, intractable, without status migrainosus: Secondary | ICD-10-CM | POA: Diagnosis not present

## 2024-08-07 DIAGNOSIS — M791 Myalgia, unspecified site: Secondary | ICD-10-CM | POA: Diagnosis not present

## 2024-08-09 DIAGNOSIS — F3181 Bipolar II disorder: Secondary | ICD-10-CM | POA: Diagnosis not present

## 2024-08-17 DIAGNOSIS — N390 Urinary tract infection, site not specified: Secondary | ICD-10-CM | POA: Diagnosis not present

## 2024-09-03 DIAGNOSIS — H5319 Other subjective visual disturbances: Secondary | ICD-10-CM | POA: Diagnosis not present

## 2024-09-03 DIAGNOSIS — H16223 Keratoconjunctivitis sicca, not specified as Sjogren's, bilateral: Secondary | ICD-10-CM | POA: Diagnosis not present

## 2024-09-03 DIAGNOSIS — H43812 Vitreous degeneration, left eye: Secondary | ICD-10-CM | POA: Diagnosis not present

## 2024-09-23 DIAGNOSIS — F3181 Bipolar II disorder: Secondary | ICD-10-CM | POA: Diagnosis not present

## 2024-10-07 DIAGNOSIS — D6489 Other specified anemias: Secondary | ICD-10-CM | POA: Diagnosis not present

## 2024-10-07 DIAGNOSIS — E88819 Insulin resistance, unspecified: Secondary | ICD-10-CM | POA: Diagnosis not present

## 2024-10-07 DIAGNOSIS — R5383 Other fatigue: Secondary | ICD-10-CM | POA: Diagnosis not present

## 2024-10-07 DIAGNOSIS — E559 Vitamin D deficiency, unspecified: Secondary | ICD-10-CM | POA: Diagnosis not present

## 2024-10-07 DIAGNOSIS — E063 Autoimmune thyroiditis: Secondary | ICD-10-CM | POA: Diagnosis not present

## 2024-10-07 DIAGNOSIS — E039 Hypothyroidism, unspecified: Secondary | ICD-10-CM | POA: Diagnosis not present

## 2024-10-07 DIAGNOSIS — R799 Abnormal finding of blood chemistry, unspecified: Secondary | ICD-10-CM | POA: Diagnosis not present

## 2024-10-08 DIAGNOSIS — E039 Hypothyroidism, unspecified: Secondary | ICD-10-CM | POA: Diagnosis not present

## 2024-10-08 DIAGNOSIS — Z87891 Personal history of nicotine dependence: Secondary | ICD-10-CM | POA: Diagnosis not present

## 2024-10-08 DIAGNOSIS — Z79899 Other long term (current) drug therapy: Secondary | ICD-10-CM | POA: Diagnosis not present

## 2024-10-08 DIAGNOSIS — R110 Nausea: Secondary | ICD-10-CM | POA: Diagnosis not present

## 2024-10-08 DIAGNOSIS — R059 Cough, unspecified: Secondary | ICD-10-CM | POA: Diagnosis not present

## 2024-10-08 DIAGNOSIS — G43001 Migraine without aura, not intractable, with status migrainosus: Secondary | ICD-10-CM | POA: Diagnosis not present

## 2024-10-08 DIAGNOSIS — Z79890 Hormone replacement therapy (postmenopausal): Secondary | ICD-10-CM | POA: Diagnosis not present

## 2024-10-08 DIAGNOSIS — H53149 Visual discomfort, unspecified: Secondary | ICD-10-CM | POA: Diagnosis not present

## 2024-10-08 DIAGNOSIS — R519 Headache, unspecified: Secondary | ICD-10-CM | POA: Diagnosis not present
